# Patient Record
Sex: Male | Born: 1945 | Race: White | Hispanic: No | Marital: Married | Smoking: Never smoker
Health system: Southern US, Community
[De-identification: ages and names within clinical notes are randomized; demographics above are authoritative.]

## PROBLEM LIST (undated history)

## (undated) DIAGNOSIS — N289 Disorder of kidney and ureter, unspecified: Secondary | ICD-10-CM

## (undated) DIAGNOSIS — D696 Thrombocytopenia, unspecified: Secondary | ICD-10-CM

## (undated) DIAGNOSIS — E119 Type 2 diabetes mellitus without complications: Secondary | ICD-10-CM

## (undated) DIAGNOSIS — I700 Atherosclerosis of aorta: Secondary | ICD-10-CM

## (undated) DIAGNOSIS — Z6841 Body Mass Index (BMI) 40.0 and over, adult: Secondary | ICD-10-CM

## (undated) DIAGNOSIS — H348122 Central retinal vein occlusion, left eye, stable: Secondary | ICD-10-CM

## (undated) DIAGNOSIS — N281 Cyst of kidney, acquired: Secondary | ICD-10-CM

## (undated) DIAGNOSIS — K7689 Other specified diseases of liver: Secondary | ICD-10-CM

## (undated) DIAGNOSIS — I509 Heart failure, unspecified: Secondary | ICD-10-CM

## (undated) DIAGNOSIS — N189 Chronic kidney disease, unspecified: Secondary | ICD-10-CM

## (undated) HISTORY — PX: CHOLECYSTECTOMY: SHX55

## (undated) HISTORY — PX: APPENDECTOMY: SHX54

---

## 2008-06-30 ENCOUNTER — Ambulatory Visit (HOSPITAL_BASED_OUTPATIENT_CLINIC_OR_DEPARTMENT_OTHER): Admission: RE | Admit: 2008-06-30 | Discharge: 2008-07-01 | Payer: Self-pay | Admitting: Orthopedic Surgery

## 2010-12-21 NOTE — Op Note (Signed)
NAME:  Logan Cole, POP NO.:  [id number]   MEDICAL RECORD NO.:  [id number]          PATIENT TYPE:  AMB   LOCATION:  DSC                          FACILITY:  [facility]   PHYSICIAN:  Robert A. Thurston Hole, M.D. DATE OF BIRTH:  [birth year]   DATE OF PROCEDURE:  06/30/2008  DATE OF DISCHARGE:                               OPERATIVE REPORT   PREOPERATIVE DIAGNOSES:  1. Right shoulder Bankart tear with partial labrum tear.  2. Right shoulder superior labrum anterior and posterior type 1 tear.  3. Right shoulder partial rotator cuff tear.  4. Right shoulder impingement.   POSTOPERATIVE DIAGNOSES:  1. Right shoulder Bankart tear with partial labrum tear.  2. Right shoulder superior labrum anterior and posterior type 1 tear.  3. Right shoulder partial rotator cuff tear.  4. Right shoulder impingement.   PROCEDURES:  1. Right shoulder examination under anesthesia followed by      arthroscopically-assisted Bankart repair using Arthrex PushLock      anchors x2.  2. Right shoulder superior labrum anterior and posterior tear      debridement.  3. Right shoulder partial rotator cuff tear debridement.  4. Right shoulder subacromial decompression.   SURGEON:  Elana Alm. Thurston Hole, MD   ASSISTANT:  Julien Girt, PA   ANESTHESIA:  General.   OPERATIVE TIME:  1 hour.   COMPLICATIONS:  None.   INDICATIONS FOR PROCEDURE:  Logan Cole is a 65 year old gentleman who had  a significant injury to his right shoulder approximately 4-5 months ago.  He was reaching for something, felt the shoulder possibly pop out and  back in.  Examining the MRI has revealed a partial rotator cuff tear,  question of a labrum tear.  He has failed conservative care and is now  to undergo arthroscopy and repair.   DESCRIPTION:  Logan Cole was brought to the operating room on June 30, 2008, after interscalene blocks placed on by Anesthesia.  He was  placed on the operating table in supine position.  He  received Ancef 2 g  IV preoperatively for prophylaxis.  After being placed under general  anesthesia, his right shoulder was examined.  Initial range of motion  showed forward flexion of 160, abduction of 160, and internal and  external rotation of 70 degrees.  A gentle manipulation was carried out  breaking up soft adhesions and improving range of motion to full range  of motion.  No definite instability was noted.  After this was done, he  was placed in a beach-chair position.  His shoulder and arm was prepped  using sterile DuraPrep and draped using sterile technique.  Originally,  through a posterior arthroscopic portal, the arthroscope with a pump  attached was placed into an anterior portal, and arthroscopic probe was  placed.  On initial inspection, the articular cartilage in the  glenohumeral joint was intact.  There was an anterior and inferior bony  Bankart lesion with a small piece of bone attached to the anterior,  inferior, and middle glenohumeral ligaments.  The superior labrum and  biceps tendon anchor was partially torn but was,  otherwise, well  attached, and this was partially debrided arthroscopically.  Posterior  labrum showed moderate fraying.  This was debrided.  The rotator cuff  showed partial tearing of 25-30% of supraspinatus and infraspinatus.  This was partially debrided arthroscopically, but it was, otherwise,  well attached, and the right rotator cuff was intact.  At this point,  through an accessory anterior portal, the anterior, inferior, and middle  glenoid rim was debrided.  After this was done, then Arthrex PushLock  anchor was deployed after mattress sutures were placed in the anterior  and inferior glenohumeral ligament and capsular bony complex, and then,  the PushLock anchor deployed in the 5 o'clock position, thus, securing  the anterior and inferior Bankart lesion back down to the glenoid rim  with a firm and tight repair.  The middle glenohumeral  ligament capsular  complex was then also repaired with another PushLock mattress suture  anchor in the 3 o'clock position with a firm and tight repair.  This  secured the entire anterior wall of the capsular tissue back down to its  anatomic position with a firm and tight repair.  No instability was  noted.  At this point, then the subacromial space was entered, and a  lateral arthroscopic portal was made.  Moderately thickened bursitis was  resected.  Rotator cuff was somewhat frayed on the bursal surface but no  other evidence of tearing.  Impingement was noted in the subacromial,  and decompression was carried out removing 6-8 mm of the undersurface of  the anterior, anterolateral, and anteromedial acromion and a CA ligament  release was carried out as well.  [facility] joint was not pathologic and  thus, was not resected.  After this was done, the shoulder could be  brought to the satisfactory range of motion with no impingement on the  rotator cuff and satisfactory fixation of the ligamentous complexes.  At  this point, I felt that all pathology had been satisfactorily dressed.  The instruments were removed.  Portals were closed with 3-0 nylon  sutures, sterile dressings and abduction sling applied, and then, the  patient awakened and taken to the recovery room in stable condition.   FOLLOWUP CARE:  [name] will follow as an outpatient on Percocet and  Robaxin with early physical therapy per protocol.  Seen back in the  office in 1 week for sutures out and followup.      Robert A. Thurston Hole, M.D.  Electronically Signed     RAW/MEDQ  D:  06/30/2008  T:  07/01/2008  Job:  [id number]

## 2011-05-10 LAB — GLUCOSE, CAPILLARY
Glucose-Capillary: 133 — ABNORMAL HIGH
Glucose-Capillary: 167 — ABNORMAL HIGH

## 2011-05-10 LAB — BASIC METABOLIC PANEL
CO2: 25
Calcium: 9.1
Creatinine, Ser: 1.35
GFR calc Af Amer: >60

## 2011-05-10 LAB — POCT HEMOGLOBIN-HEMACUE: Hemoglobin: 12.2 — ABNORMAL LOW

## 2017-09-26 ENCOUNTER — Inpatient Hospital Stay (HOSPITAL_COMMUNITY)
Admission: EM | Admit: 2017-09-26 | Discharge: 2017-10-01 | DRG: 291 | Disposition: A | Discharge status: Home / Self Care | Payer: Non-veteran care | Attending: Family Medicine | Admitting: Family Medicine

## 2017-09-26 ENCOUNTER — Encounter (HOSPITAL_COMMUNITY): Payer: Self-pay

## 2017-09-26 ENCOUNTER — Other Ambulatory Visit: Payer: Self-pay

## 2017-09-26 ENCOUNTER — Emergency Department (HOSPITAL_COMMUNITY): Payer: Non-veteran care

## 2017-09-26 DIAGNOSIS — I130 Hypertensive heart and chronic kidney disease with heart failure and stage 1 through stage 4 chronic kidney disease, or unspecified chronic kidney disease: Secondary | ICD-10-CM | POA: Diagnosis not present

## 2017-09-26 DIAGNOSIS — N183 Chronic kidney disease, stage 3 unspecified: Secondary | ICD-10-CM | POA: Diagnosis present

## 2017-09-26 DIAGNOSIS — K7689 Other specified diseases of liver: Secondary | ICD-10-CM

## 2017-09-26 DIAGNOSIS — E1122 Type 2 diabetes mellitus with diabetic chronic kidney disease: Secondary | ICD-10-CM | POA: Diagnosis present

## 2017-09-26 DIAGNOSIS — R0600 Dyspnea, unspecified: Secondary | ICD-10-CM | POA: Diagnosis present

## 2017-09-26 DIAGNOSIS — K7200 Acute and subacute hepatic failure without coma: Secondary | ICD-10-CM | POA: Diagnosis present

## 2017-09-26 DIAGNOSIS — E1322 Other specified diabetes mellitus with diabetic chronic kidney disease: Secondary | ICD-10-CM

## 2017-09-26 DIAGNOSIS — E778 Other disorders of glycoprotein metabolism: Secondary | ICD-10-CM | POA: Diagnosis present

## 2017-09-26 DIAGNOSIS — E1365 Other specified diabetes mellitus with hyperglycemia: Secondary | ICD-10-CM

## 2017-09-26 DIAGNOSIS — R140 Abdominal distension (gaseous): Secondary | ICD-10-CM | POA: Diagnosis present

## 2017-09-26 DIAGNOSIS — D689 Coagulation defect, unspecified: Secondary | ICD-10-CM | POA: Diagnosis present

## 2017-09-26 DIAGNOSIS — T501X5A Adverse effect of loop [high-ceiling] diuretics, initial encounter: Secondary | ICD-10-CM | POA: Diagnosis present

## 2017-09-26 DIAGNOSIS — Z79899 Other long term (current) drug therapy: Secondary | ICD-10-CM

## 2017-09-26 DIAGNOSIS — Z7951 Long term (current) use of inhaled steroids: Secondary | ICD-10-CM

## 2017-09-26 DIAGNOSIS — E8809 Other disorders of plasma-protein metabolism, not elsewhere classified: Secondary | ICD-10-CM | POA: Diagnosis present

## 2017-09-26 DIAGNOSIS — I5033 Acute on chronic diastolic (congestive) heart failure: Secondary | ICD-10-CM | POA: Diagnosis present

## 2017-09-26 DIAGNOSIS — D61818 Other pancytopenia: Secondary | ICD-10-CM | POA: Diagnosis present

## 2017-09-26 DIAGNOSIS — D696 Thrombocytopenia, unspecified: Secondary | ICD-10-CM | POA: Diagnosis present

## 2017-09-26 DIAGNOSIS — N179 Acute kidney failure, unspecified: Secondary | ICD-10-CM | POA: Diagnosis present

## 2017-09-26 DIAGNOSIS — E1129 Type 2 diabetes mellitus with other diabetic kidney complication: Secondary | ICD-10-CM | POA: Diagnosis present

## 2017-09-26 DIAGNOSIS — R109 Unspecified abdominal pain: Secondary | ICD-10-CM | POA: Diagnosis present

## 2017-09-26 DIAGNOSIS — I509 Heart failure, unspecified: Secondary | ICD-10-CM

## 2017-09-26 DIAGNOSIS — Z794 Long term (current) use of insulin: Secondary | ICD-10-CM

## 2017-09-26 DIAGNOSIS — E876 Hypokalemia: Secondary | ICD-10-CM | POA: Diagnosis present

## 2017-09-26 DIAGNOSIS — Z7902 Long term (current) use of antithrombotics/antiplatelets: Secondary | ICD-10-CM

## 2017-09-26 DIAGNOSIS — I700 Atherosclerosis of aorta: Secondary | ICD-10-CM | POA: Diagnosis present

## 2017-09-26 DIAGNOSIS — R0602 Shortness of breath: Secondary | ICD-10-CM | POA: Diagnosis not present

## 2017-09-26 DIAGNOSIS — R188 Other ascites: Secondary | ICD-10-CM | POA: Diagnosis present

## 2017-09-26 DIAGNOSIS — K7460 Unspecified cirrhosis of liver: Secondary | ICD-10-CM | POA: Diagnosis present

## 2017-09-26 DIAGNOSIS — N182 Chronic kidney disease, stage 2 (mild): Secondary | ICD-10-CM

## 2017-09-26 DIAGNOSIS — R0989 Other specified symptoms and signs involving the circulatory and respiratory systems: Secondary | ICD-10-CM | POA: Diagnosis present

## 2017-09-26 DIAGNOSIS — Z833 Family history of diabetes mellitus: Secondary | ICD-10-CM

## 2017-09-26 DIAGNOSIS — R601 Generalized edema: Secondary | ICD-10-CM

## 2017-09-26 DIAGNOSIS — T502X5A Adverse effect of carbonic-anhydrase inhibitors, benzothiadiazides and other diuretics, initial encounter: Secondary | ICD-10-CM | POA: Diagnosis present

## 2017-09-26 DIAGNOSIS — IMO0002 Reserved for concepts with insufficient information to code with codable children: Secondary | ICD-10-CM

## 2017-09-26 DIAGNOSIS — E119 Type 2 diabetes mellitus without complications: Secondary | ICD-10-CM

## 2017-09-26 DIAGNOSIS — Z6841 Body Mass Index (BMI) 40.0 and over, adult: Secondary | ICD-10-CM

## 2017-09-26 HISTORY — DX: Heart failure, unspecified: I50.9

## 2017-09-26 HISTORY — DX: Body Mass Index (BMI) 40.0 and over, adult: Z684

## 2017-09-26 HISTORY — DX: Disorder of kidney and ureter, unspecified: N28.9

## 2017-09-26 HISTORY — DX: Chronic kidney disease, unspecified: N18.9

## 2017-09-26 HISTORY — DX: Other specified diseases of liver: K76.89

## 2017-09-26 HISTORY — DX: Central retinal vein occlusion, left eye, stable: H34.8122

## 2017-09-26 HISTORY — DX: Type 2 diabetes mellitus without complications: E11.9

## 2017-09-26 HISTORY — DX: Thrombocytopenia, unspecified: D69.6

## 2017-09-26 HISTORY — DX: Atherosclerosis of aorta: I70.0

## 2017-09-26 HISTORY — DX: Cyst of kidney, acquired: N28.1

## 2017-09-26 HISTORY — DX: Morbid (severe) obesity due to excess calories: E66.01

## 2017-09-26 LAB — I-STAT TROPONIN, ED: Troponin i, poc: 0 ng/mL (ref 0.00–0.08)

## 2017-09-26 LAB — BASIC METABOLIC PANEL
ANION GAP: 10 (ref 5–15)
BUN: 13 mg/dL (ref 6–20)
CALCIUM: 7.9 mg/dL — AB (ref 8.9–10.3)
CO2: 26 mmol/L (ref 22–32)
Chloride: 102 mmol/L (ref 101–111)
Creatinine, Ser: 1.54 mg/dL — ABNORMAL HIGH (ref 0.61–1.24)
GFR calc Af Amer: 50 mL/min — ABNORMAL LOW (ref >60)
GFR calc non Af Amer: 43 mL/min — ABNORMAL LOW (ref >60)
GLUCOSE: 197 mg/dL — AB (ref 65–99)
Potassium: 3.6 mmol/L (ref 3.5–5.1)
Sodium: 138 mmol/L (ref 135–145)

## 2017-09-26 LAB — CBC
HEMATOCRIT: 29.3 % — AB (ref 39.0–52.0)
HEMOGLOBIN: 9.3 g/dL — AB (ref 13.0–17.0)
MCH: 28.4 pg (ref 26.0–34.0)
MCHC: 31.7 g/dL (ref 30.0–36.0)
MCV: 89.6 fL (ref 78.0–100.0)
Platelets: 120 10*3/uL — ABNORMAL LOW (ref 150–400)
RBC: 3.27 MIL/uL — ABNORMAL LOW (ref 4.22–5.81)
RDW: 14.9 % (ref 11.5–15.5)
WBC: 4 10*3/uL (ref 4.0–10.5)

## 2017-09-26 MED ORDER — HYDROMORPHONE HCL 1 MG/ML IJ SOLN
1.0000 mg | Freq: Once | INTRAMUSCULAR | Status: AC
  Administered 2017-09-27: 1 mg via INTRAVENOUS
  Filled 2017-09-26: qty 1

## 2017-09-26 MED ORDER — FUROSEMIDE 10 MG/ML IJ SOLN
40.0000 mg | INTRAMUSCULAR | Status: AC
  Administered 2017-09-27: 40 mg via INTRAVENOUS
  Filled 2017-09-26: qty 4

## 2017-09-26 MED ORDER — ONDANSETRON HCL 4 MG/2ML IJ SOLN
4.0000 mg | Freq: Once | INTRAMUSCULAR | Status: AC
  Administered 2017-09-27: 4 mg via INTRAVENOUS
  Filled 2017-09-26: qty 2

## 2017-09-26 NOTE — ED Provider Notes (Signed)
[facility] EMERGENCY DEPARTMENT Provider Note   CSN: [id number] Arrival date & time: 09/26/17  1750     History   Chief Complaint Chief Complaint  Patient presents with  . Abdominal Pain    HPI Logan Cole is a 72 y.o. male.  Patient with PMH of CHF, liver disease, renal disorder, and DM presents to the ED with a chief complaint of abdominal distention.  He reports having had intermittent abdominal pain and distension for years, but reports that the symptoms acutely worsened this week.  He states that the distension is severe.  He reports that his pain is 7/10.  He reports associated nausea and vomiting.  He has been having normal BMs.  He reports some associated SOB.  He denies any other associated symptoms.   The history is provided by the patient. No language interpreter was used.    Past Medical History:  Diagnosis Date  . CHF (congestive heart failure) (HCC)   . Diabetes mellitus without complication (HCC)   . Liver dysfunction   . Renal disorder     There are no active problems to display for this patient.   Past Surgical History:  Procedure Laterality Date  . APPENDECTOMY    . CHOLECYSTECTOMY         Home Medications    Prior to Admission medications   Medication Sig Start Date End Date Taking? Authorizing Provider  acetaminophen (TYLENOL) 500 MG tablet Take 500 mg by mouth every 6 (six) hours as needed for mild pain.   Yes [provider]  ammonium lactate (LAC-HYDRIN) 12 % lotion Apply 1 application topically daily as needed for dry skin.   Yes [provider]  capsaicin (ZOSTRIX) 0.025 % cream Apply 1 application topically 3 (three) times daily as needed (skin care).   Yes [provider]  chlorhexidine (PERIDEX) 0.12 % solution Use as directed 15 mLs in the mouth or throat 2 (two) times daily as needed (mouth care).   Yes [provider]  clopidogrel (PLAVIX) 75 MG tablet Take 75 mg by mouth  daily.   Yes [provider]  ferrous sulfate 325 (65 FE) MG tablet Take 325 mg by mouth daily.   Yes [provider]  FLUoxetine (PROZAC) 20 MG tablet Take 40 mg by mouth every morning.   Yes [provider]  folic acid (FOLVITE) 1 MG tablet Take 1 mg by mouth daily.   Yes [provider]  gabapentin (NEURONTIN) 300 MG capsule Take 300 mg by mouth 3 (three) times daily.   Yes [provider]  hydrOXYzine (ATARAX/VISTARIL) 50 MG tablet Take 50 mg by mouth 3 (three) times daily.   Yes [provider]  insulin aspart (NOVOLOG FLEXPEN) 100 UNIT/ML FlexPen Inject 1-50 Units into the skin 3 (three) times daily with meals.   Yes [provider]  insulin glargine (LANTUS) 100 unit/mL SOPN Inject 50 Units into the skin 2 (two) times daily.   Yes [provider]  ipratropium-albuterol (DUONEB) 0.5-2.5 (3) MG/3ML SOLN Take 3 mLs by nebulization every 6 (six) hours as needed (SOB).   Yes [provider]  loperamide (IMODIUM) 2 MG capsule Take 2 mg by mouth 4 (four) times daily as needed for diarrhea or loose stools.   Yes [provider]  loratadine (CLARITIN) 10 MG tablet Take 10 mg by mouth daily as needed for allergies.   Yes [provider]  metoprolol tartrate (LOPRESSOR) 25 MG tablet Take 12.5 mg by  mouth 2 (two) times daily.   Yes [provider]  oxyCODONE (OXY IR/ROXICODONE) 5 MG immediate release tablet Take 10 mg by mouth every 4 (four) hours as needed for severe pain.   Yes [provider]  pantoprazole (PROTONIX) 40 MG tablet Take 40 mg by mouth daily as needed (acid reflux).   Yes [provider]  pilocarpine (SALAGEN) 5 MG tablet Take 5 mg by mouth 4 (four) times daily.   Yes [provider]  pravastatin (PRAVACHOL) 20 MG tablet Take 20 mg by mouth at bedtime.   Yes [provider]  prazosin (MINIPRESS) 5 MG capsule Take 10 mg by mouth at bedtime.   Yes  [provider]  QUEtiapine (SEROQUEL) 100 MG tablet Take 100 mg by mouth at bedtime.   Yes [provider]  rifaximin (XIFAXAN) 550 MG TABS tablet Take 550 mg by mouth 3 (three) times daily.   Yes [provider]  sodium fluoride (LURIDE) 1.1 (0.5 F) MG/ML SOLN Take 1 drop by mouth 2 (two) times daily as needed (mouth care).   Yes [provider]  zinc sulfate 220 (50 Zn) MG capsule Take 220 mg by mouth daily.   Yes [provider]    Family History No family history on file.  Social History Social History   Tobacco Use  . Smoking status: Never Smoker  . Smokeless tobacco: Never Used  Substance Use Topics  . Alcohol use: No    Frequency: Never  . Drug use: No     Allergies   Morphine and related   Review of Systems Review of Systems  All other systems reviewed and are negative.    Physical Exam Updated Vital Signs BP (!) 155/69   Pulse 70   Temp 99 F (37.2 C) (Oral)   Resp 16   Ht 6' (1.829 m)   Wt (!) 154.2 kg (340 lb)   SpO2 98%   BMI 46.11 kg/m   Physical Exam  Constitutional: He is oriented to person, place, and time. He appears well-developed and well-nourished.  HENT:  Head: Normocephalic and atraumatic.  Eyes: Conjunctivae and EOM are normal. Pupils are equal, round, and reactive to light. Right eye exhibits no discharge. Left eye exhibits no discharge. No scleral icterus.  Neck: Normal range of motion. Neck supple. No JVD present.  Cardiovascular: Normal rate, regular rhythm and normal heart sounds. Exam reveals no gallop and no friction rub.  No murmur heard. Pulmonary/Chest: Effort normal and breath sounds normal. No respiratory distress. He has no wheezes. He has no rales. He exhibits no tenderness.  Abdominal: Soft. He exhibits distension. He exhibits no mass. There is no tenderness. There is no rebound and no guarding.  Musculoskeletal: Normal range of motion. He exhibits edema. He exhibits no  tenderness.  1+ pitting edema  Neurological: He is alert and oriented to person, place, and time.  Skin: Skin is warm and dry.  Psychiatric: He has a normal mood and affect. His behavior is normal. Judgment and thought content normal.  Nursing note and vitals reviewed.    ED Treatments / Results  Labs (all labs ordered are listed, but only abnormal results are displayed) Labs Reviewed  BASIC METABOLIC PANEL - Abnormal; Notable for the following components:      Result Value   Glucose, Bld 197 (*)    Creatinine, Ser 1.54 (*)    Calcium 7.9 (*)    GFR calc non Af Amer 43 (*)  GFR calc Af Amer 50 (*)    All other components within normal limits  CBC - Abnormal; Notable for the following components:   RBC 3.27 (*)    Hemoglobin 9.3 (*)    HCT 29.3 (*)    Platelets 120 (*)    All other components within normal limits  BRAIN NATRIURETIC PEPTIDE  I-STAT TROPONIN, ED    EKG  EKG Interpretation None       Radiology Dg Chest 2 View  Result Date: 09/26/2017 CLINICAL DATA:  Cough and fever EXAM: CHEST  2 VIEW COMPARISON:  November 14, 2016 FINDINGS: There is no edema or consolidation. There is cardiomegaly with pulmonary venous hypertension. No adenopathy. There is aortic atherosclerosis. No evident bone lesions. IMPRESSION: Pulmonary vascular congestion without edema or consolidation. There is aortic atherosclerosis. There is mild elevation of the left hemidiaphragm. Aortic Atherosclerosis (ICD10-I70.0). Electronically Signed   By: Bretta BangWilliam  Woodruff III M.D.   On: 09/26/2017 19:14    Procedures Procedures (including critical care time)  Medications Ordered in ED Medications  HYDROmorphone (DILAUDID) injection 1 mg (not administered)  ondansetron (ZOFRAN) injection 4 mg (not administered)  furosemide (LASIX) injection 40 mg (not administered)     Initial Impression / Assessment and Plan / ED Course  I have reviewed the triage vital signs and the nursing notes.  Pertinent  labs & imaging results that were available during my care of the patient were reviewed by me and considered in my medical decision making (see chart for details).     Patient with abdominal pain, distension, SOB, and n/v.  No diarrhea.  VSS.  Obviously distended.  Hx of CHF.  Likely volume overload, but given pain, n/v, will check CT.  Will give 40 mg lasix.  Will reassess.  CT shows ascites.  VSS.  Afebrile.  Doubt SBP, but will cover with rocephin.  Seen by and discussed with Dr. Nicanor AlconPalumbo, who agrees with plan for admission.  Appreciate Dr. Clyde LundborgNiu, for admitting the patient.  Final Clinical Impressions(s) / ED Diagnoses   Final diagnoses:  Other ascites  Anasarca    ED Discharge Orders    None       Roxy HorsemanBrowning, Ethel Meisenheimer, PA-C 09/27/17 [id number]    Palumbo, April, MD 09/27/17 [phone]

## 2017-09-26 NOTE — ED Triage Notes (Signed)
Per Pt, Pt is coming from home with complaints of RUQ pain that has been intermittent for one year. Reports some vomiting and diarrhea along with increase fluid on his body.

## 2017-09-27 ENCOUNTER — Inpatient Hospital Stay (HOSPITAL_COMMUNITY): Payer: Non-veteran care

## 2017-09-27 ENCOUNTER — Encounter (HOSPITAL_COMMUNITY): Payer: Self-pay | Admitting: Emergency Medicine

## 2017-09-27 ENCOUNTER — Emergency Department (HOSPITAL_COMMUNITY): Payer: Non-veteran care

## 2017-09-27 ENCOUNTER — Other Ambulatory Visit: Payer: Self-pay

## 2017-09-27 DIAGNOSIS — R0989 Other specified symptoms and signs involving the circulatory and respiratory systems: Secondary | ICD-10-CM | POA: Diagnosis not present

## 2017-09-27 DIAGNOSIS — Z794 Long term (current) use of insulin: Secondary | ICD-10-CM | POA: Diagnosis not present

## 2017-09-27 DIAGNOSIS — R0600 Dyspnea, unspecified: Secondary | ICD-10-CM | POA: Diagnosis present

## 2017-09-27 DIAGNOSIS — Z6841 Body Mass Index (BMI) 40.0 and over, adult: Secondary | ICD-10-CM | POA: Diagnosis not present

## 2017-09-27 DIAGNOSIS — K7460 Unspecified cirrhosis of liver: Secondary | ICD-10-CM | POA: Diagnosis present

## 2017-09-27 DIAGNOSIS — I700 Atherosclerosis of aorta: Secondary | ICD-10-CM | POA: Diagnosis present

## 2017-09-27 DIAGNOSIS — K7200 Acute and subacute hepatic failure without coma: Secondary | ICD-10-CM | POA: Diagnosis present

## 2017-09-27 DIAGNOSIS — N281 Cyst of kidney, acquired: Secondary | ICD-10-CM | POA: Insufficient documentation

## 2017-09-27 DIAGNOSIS — E119 Type 2 diabetes mellitus without complications: Secondary | ICD-10-CM | POA: Diagnosis not present

## 2017-09-27 DIAGNOSIS — Z79899 Other long term (current) drug therapy: Secondary | ICD-10-CM | POA: Diagnosis not present

## 2017-09-27 DIAGNOSIS — R140 Abdominal distension (gaseous): Secondary | ICD-10-CM | POA: Diagnosis not present

## 2017-09-27 DIAGNOSIS — N183 Chronic kidney disease, stage 3 unspecified: Secondary | ICD-10-CM | POA: Diagnosis present

## 2017-09-27 DIAGNOSIS — E876 Hypokalemia: Secondary | ICD-10-CM | POA: Diagnosis present

## 2017-09-27 DIAGNOSIS — N179 Acute kidney failure, unspecified: Secondary | ICD-10-CM | POA: Diagnosis present

## 2017-09-27 DIAGNOSIS — I130 Hypertensive heart and chronic kidney disease with heart failure and stage 1 through stage 4 chronic kidney disease, or unspecified chronic kidney disease: Secondary | ICD-10-CM | POA: Diagnosis present

## 2017-09-27 DIAGNOSIS — D696 Thrombocytopenia, unspecified: Secondary | ICD-10-CM | POA: Diagnosis not present

## 2017-09-27 DIAGNOSIS — E1322 Other specified diabetes mellitus with diabetic chronic kidney disease: Secondary | ICD-10-CM | POA: Diagnosis not present

## 2017-09-27 DIAGNOSIS — I509 Heart failure, unspecified: Secondary | ICD-10-CM

## 2017-09-27 DIAGNOSIS — R109 Unspecified abdominal pain: Secondary | ICD-10-CM | POA: Diagnosis present

## 2017-09-27 DIAGNOSIS — E1122 Type 2 diabetes mellitus with diabetic chronic kidney disease: Secondary | ICD-10-CM | POA: Diagnosis present

## 2017-09-27 DIAGNOSIS — D689 Coagulation defect, unspecified: Secondary | ICD-10-CM | POA: Diagnosis present

## 2017-09-27 DIAGNOSIS — I5033 Acute on chronic diastolic (congestive) heart failure: Secondary | ICD-10-CM | POA: Diagnosis present

## 2017-09-27 DIAGNOSIS — Z833 Family history of diabetes mellitus: Secondary | ICD-10-CM | POA: Diagnosis not present

## 2017-09-27 DIAGNOSIS — E8809 Other disorders of plasma-protein metabolism, not elsewhere classified: Secondary | ICD-10-CM | POA: Diagnosis present

## 2017-09-27 DIAGNOSIS — R0602 Shortness of breath: Secondary | ICD-10-CM | POA: Diagnosis present

## 2017-09-27 DIAGNOSIS — T501X5A Adverse effect of loop [high-ceiling] diuretics, initial encounter: Secondary | ICD-10-CM | POA: Diagnosis present

## 2017-09-27 DIAGNOSIS — E1129 Type 2 diabetes mellitus with other diabetic kidney complication: Secondary | ICD-10-CM | POA: Diagnosis present

## 2017-09-27 DIAGNOSIS — R188 Other ascites: Secondary | ICD-10-CM | POA: Diagnosis present

## 2017-09-27 DIAGNOSIS — D61818 Other pancytopenia: Secondary | ICD-10-CM | POA: Diagnosis present

## 2017-09-27 DIAGNOSIS — R1011 Right upper quadrant pain: Secondary | ICD-10-CM | POA: Diagnosis not present

## 2017-09-27 DIAGNOSIS — E778 Other disorders of glycoprotein metabolism: Secondary | ICD-10-CM | POA: Diagnosis present

## 2017-09-27 DIAGNOSIS — Z7951 Long term (current) use of inhaled steroids: Secondary | ICD-10-CM | POA: Diagnosis not present

## 2017-09-27 DIAGNOSIS — K7689 Other specified diseases of liver: Secondary | ICD-10-CM | POA: Insufficient documentation

## 2017-09-27 DIAGNOSIS — T502X5A Adverse effect of carbonic-anhydrase inhibitors, benzothiadiazides and other diuretics, initial encounter: Secondary | ICD-10-CM | POA: Diagnosis present

## 2017-09-27 DIAGNOSIS — Z7902 Long term (current) use of antithrombotics/antiplatelets: Secondary | ICD-10-CM | POA: Diagnosis not present

## 2017-09-27 DIAGNOSIS — R601 Generalized edema: Secondary | ICD-10-CM | POA: Diagnosis not present

## 2017-09-27 DIAGNOSIS — N182 Chronic kidney disease, stage 2 (mild): Secondary | ICD-10-CM | POA: Diagnosis not present

## 2017-09-27 LAB — URINALYSIS, ROUTINE W REFLEX MICROSCOPIC
BILIRUBIN URINE: NEGATIVE
Glucose, UA: NEGATIVE mg/dL
HGB URINE DIPSTICK: NEGATIVE
KETONES UR: NEGATIVE mg/dL
Leukocytes, UA: NEGATIVE
NITRITE: NEGATIVE
PROTEIN: NEGATIVE mg/dL
Specific Gravity, Urine: 1.013 (ref 1.005–1.030)
pH: 5 (ref 5.0–8.0)

## 2017-09-27 LAB — PROTIME-INR
INR: 1.5
Prothrombin Time: 18 seconds — ABNORMAL HIGH (ref 11.4–15.2)

## 2017-09-27 LAB — HEMOGLOBIN A1C
Hgb A1c MFr Bld: 6.4 % — ABNORMAL HIGH (ref 4.8–5.6)
MEAN PLASMA GLUCOSE: 136.98 mg/dL

## 2017-09-27 LAB — HEPATIC FUNCTION PANEL
ALBUMIN: 2.3 g/dL — AB (ref 3.5–5.0)
ALT: 14 U/L — AB (ref 17–63)
AST: 31 U/L (ref 15–41)
Alkaline Phosphatase: 63 U/L (ref 38–126)
BILIRUBIN DIRECT: 0.2 mg/dL (ref 0.1–0.5)
BILIRUBIN TOTAL: 0.9 mg/dL (ref 0.3–1.2)
Indirect Bilirubin: 0.7 mg/dL (ref 0.3–0.9)
Total Protein: 6.5 g/dL (ref 6.5–8.1)

## 2017-09-27 LAB — GLUCOSE, CAPILLARY
GLUCOSE-CAPILLARY: 142 mg/dL — AB (ref 65–99)
GLUCOSE-CAPILLARY: 134 mg/dL — AB (ref 65–99)
GLUCOSE-CAPILLARY: 170 mg/dL — AB (ref 65–99)
Glucose-Capillary: 151 mg/dL — ABNORMAL HIGH (ref 65–99)

## 2017-09-27 LAB — AMMONIA: Ammonia: 45 umol/L — ABNORMAL HIGH (ref 9–35)

## 2017-09-27 LAB — BRAIN NATRIURETIC PEPTIDE: B Natriuretic Peptide: 57.5 pg/mL (ref 0.0–100.0)

## 2017-09-27 LAB — CBG MONITORING, ED: Glucose-Capillary: 149 mg/dL — ABNORMAL HIGH (ref 65–99)

## 2017-09-27 MED ORDER — SODIUM CHLORIDE 0.9 % IV SOLN
1.0000 g | Freq: Once | INTRAVENOUS | Status: AC
  Administered 2017-09-27: 1 g via INTRAVENOUS
  Filled 2017-09-27: qty 10

## 2017-09-27 MED ORDER — SODIUM CHLORIDE 0.9% FLUSH: 3.0000 mL | INTRAVENOUS | Status: DC | PRN

## 2017-09-27 MED ORDER — INSULIN ASPART 100 UNIT/ML ~~LOC~~ SOLN
0.0000 [IU] | Freq: Three times a day (TID) | SUBCUTANEOUS | Status: DC
  Administered 2017-09-27 (×2): 2 [IU] via SUBCUTANEOUS
  Administered 2017-09-27 – 2017-09-28 (×2): 3 [IU] via SUBCUTANEOUS
  Administered 2017-09-28: 2 [IU] via SUBCUTANEOUS
  Administered 2017-09-28 – 2017-09-29 (×2): 3 [IU] via SUBCUTANEOUS
  Administered 2017-09-29: 2 [IU] via SUBCUTANEOUS
  Administered 2017-09-29: 3 [IU] via SUBCUTANEOUS
  Administered 2017-09-30: 2 [IU] via SUBCUTANEOUS
  Administered 2017-09-30 – 2017-10-01 (×3): 3 [IU] via SUBCUTANEOUS

## 2017-09-27 MED ORDER — ONDANSETRON HCL 4 MG PO TABS
4.0000 mg | ORAL_TABLET | Freq: Four times a day (QID) | ORAL | Status: DC | PRN
  Administered 2017-09-27 – 2017-09-29 (×2): 4 mg via ORAL
  Filled 2017-09-27 (×2): qty 1

## 2017-09-27 MED ORDER — SODIUM CHLORIDE 0.9 % IV SOLN
2.0000 g | INTRAVENOUS | Status: DC
  Administered 2017-09-27: 2 g via INTRAVENOUS
  Filled 2017-09-27: qty 20

## 2017-09-27 MED ORDER — FUROSEMIDE 10 MG/ML IJ SOLN
40.0000 mg | INTRAMUSCULAR | Status: AC
  Administered 2017-09-27: 40 mg via INTRAVENOUS
  Filled 2017-09-27: qty 4

## 2017-09-27 MED ORDER — LOPERAMIDE HCL 2 MG PO CAPS: 2.0000 mg | ORAL_CAPSULE | Freq: Four times a day (QID) | ORAL | Status: DC | PRN

## 2017-09-27 MED ORDER — INSULIN GLARGINE 100 UNIT/ML ~~LOC~~ SOLN
35.0000 [IU] | Freq: Two times a day (BID) | SUBCUTANEOUS | Status: DC
  Filled 2017-09-27: qty 0.35

## 2017-09-27 MED ORDER — PRAZOSIN HCL 2 MG PO CAPS
10.0000 mg | ORAL_CAPSULE | Freq: Every day | ORAL | Status: DC
  Administered 2017-09-28 – 2017-09-30 (×4): 10 mg via ORAL
  Filled 2017-09-27 (×5): qty 5

## 2017-09-27 MED ORDER — CAPSAICIN 0.025 % EX CREA: 1.0000 "application " | TOPICAL_CREAM | Freq: Three times a day (TID) | CUTANEOUS | Status: DC | PRN

## 2017-09-27 MED ORDER — OXYCODONE HCL 5 MG PO TABS
10.0000 mg | ORAL_TABLET | ORAL | Status: DC | PRN
  Administered 2017-09-27 – 2017-09-28 (×4): 10 mg via ORAL
  Filled 2017-09-27 (×4): qty 2

## 2017-09-27 MED ORDER — PRAVASTATIN SODIUM 20 MG PO TABS: 20.0000 mg | ORAL_TABLET | Freq: Every day | ORAL | Status: DC

## 2017-09-27 MED ORDER — ZINC SULFATE 220 (50 ZN) MG PO CAPS
220.0000 mg | ORAL_CAPSULE | Freq: Every day | ORAL | Status: DC
  Administered 2017-09-27 – 2017-10-01 (×5): 220 mg via ORAL
  Filled 2017-09-27 (×6): qty 1

## 2017-09-27 MED ORDER — IOPAMIDOL (ISOVUE-300) INJECTION 61%
INTRAVENOUS | Status: AC
  Administered 2017-09-27: 100 mL
  Filled 2017-09-27: qty 100

## 2017-09-27 MED ORDER — FOLIC ACID 1 MG PO TABS
1.0000 mg | ORAL_TABLET | Freq: Every day | ORAL | Status: DC
Start: 2017-09-27 — End: 2017-10-01
  Administered 2017-09-27 – 2017-10-01 (×5): 1 mg via ORAL
  Filled 2017-09-27 (×5): qty 1

## 2017-09-27 MED ORDER — INSULIN ASPART 100 UNIT/ML ~~LOC~~ SOLN: 0.0000 [IU] | Freq: Three times a day (TID) | SUBCUTANEOUS | Status: DC

## 2017-09-27 MED ORDER — RIFAXIMIN 550 MG PO TABS
550.0000 mg | ORAL_TABLET | Freq: Three times a day (TID) | ORAL | Status: DC
  Administered 2017-09-27 – 2017-10-01 (×13): 550 mg via ORAL
  Filled 2017-09-27 (×14): qty 1

## 2017-09-27 MED ORDER — FERROUS SULFATE 325 (65 FE) MG PO TABS: 325.0000 mg | ORAL_TABLET | Freq: Every day | ORAL | Status: DC

## 2017-09-27 MED ORDER — FLUOXETINE HCL 20 MG PO CAPS
40.0000 mg | ORAL_CAPSULE | Freq: Every day | ORAL | Status: DC
  Administered 2017-09-27 – 2017-10-01 (×5): 40 mg via ORAL
  Filled 2017-09-27 (×5): qty 2

## 2017-09-27 MED ORDER — BISACODYL 5 MG PO TBEC: 5.0000 mg | DELAYED_RELEASE_TABLET | Freq: Every day | ORAL | Status: DC | PRN

## 2017-09-27 MED ORDER — AMMONIUM LACTATE 12 % EX LOTN: 1.0000 "application " | TOPICAL_LOTION | Freq: Every day | CUTANEOUS | Status: DC | PRN

## 2017-09-27 MED ORDER — ACETAMINOPHEN 500 MG PO TABS: 500.0000 mg | ORAL_TABLET | Freq: Four times a day (QID) | ORAL | Status: DC | PRN

## 2017-09-27 MED ORDER — LORATADINE 10 MG PO TABS: 10.0000 mg | ORAL_TABLET | Freq: Every day | ORAL | Status: DC | PRN

## 2017-09-27 MED ORDER — QUETIAPINE FUMARATE 25 MG PO TABS
100.0000 mg | ORAL_TABLET | Freq: Every day | ORAL | Status: DC
  Administered 2017-09-27 – 2017-09-30 (×4): 100 mg via ORAL
  Filled 2017-09-27 (×4): qty 4

## 2017-09-27 MED ORDER — FUROSEMIDE 10 MG/ML IJ SOLN: 60.0000 mg | Freq: Two times a day (BID) | INTRAMUSCULAR | Status: DC

## 2017-09-27 MED ORDER — SODIUM CHLORIDE 0.9% FLUSH
3.0000 mL | Freq: Two times a day (BID) | INTRAVENOUS | Status: DC
  Administered 2017-09-27 – 2017-09-30 (×7): 3 mL via INTRAVENOUS

## 2017-09-27 MED ORDER — LACTULOSE 10 GM/15ML PO SOLN
20.0000 g | Freq: Two times a day (BID) | ORAL | Status: DC
  Administered 2017-09-27 – 2017-09-29 (×5): 20 g via ORAL
  Filled 2017-09-27 (×5): qty 30

## 2017-09-27 MED ORDER — CLOPIDOGREL BISULFATE 75 MG PO TABS: 75.0000 mg | ORAL_TABLET | Freq: Every day | ORAL | Status: DC

## 2017-09-27 MED ORDER — IPRATROPIUM-ALBUTEROL 0.5-2.5 (3) MG/3ML IN SOLN: 3.0000 mL | Freq: Four times a day (QID) | RESPIRATORY_TRACT | Status: DC | PRN

## 2017-09-27 MED ORDER — INSULIN ASPART 100 UNIT/ML ~~LOC~~ SOLN
0.0000 [IU] | Freq: Every day | SUBCUTANEOUS | Status: DC
  Administered 2017-09-30: 2 [IU] via SUBCUTANEOUS

## 2017-09-27 MED ORDER — HYDROXYZINE HCL 25 MG PO TABS
50.0000 mg | ORAL_TABLET | Freq: Three times a day (TID) | ORAL | Status: DC
  Administered 2017-09-27 – 2017-09-29 (×7): 50 mg via ORAL
  Filled 2017-09-27 (×7): qty 2

## 2017-09-27 MED ORDER — METOPROLOL TARTRATE 12.5 MG HALF TABLET
12.5000 mg | ORAL_TABLET | Freq: Two times a day (BID) | ORAL | Status: DC
  Administered 2017-09-27 (×2): 12.5 mg via ORAL
  Filled 2017-09-27 (×3): qty 1

## 2017-09-27 MED ORDER — PROMETHAZINE HCL 25 MG/ML IJ SOLN
12.5000 mg | Freq: Once | INTRAMUSCULAR | Status: AC
  Administered 2017-09-27: 12.5 mg via INTRAVENOUS
  Filled 2017-09-27: qty 1

## 2017-09-27 MED ORDER — ONDANSETRON HCL 4 MG/2ML IJ SOLN: 4.0000 mg | Freq: Four times a day (QID) | INTRAMUSCULAR | Status: DC | PRN

## 2017-09-27 MED ORDER — PANTOPRAZOLE SODIUM 40 MG PO TBEC: 40.0000 mg | DELAYED_RELEASE_TABLET | Freq: Every day | ORAL | Status: DC | PRN

## 2017-09-27 MED ORDER — SODIUM CHLORIDE 0.9 % IV SOLN: 250.0000 mL | INTRAVENOUS | Status: DC | PRN

## 2017-09-27 MED ORDER — IPRATROPIUM-ALBUTEROL 0.5-2.5 (3) MG/3ML IN SOLN
3.0000 mL | Freq: Four times a day (QID) | RESPIRATORY_TRACT | Status: DC
  Administered 2017-09-27 (×3): 3 mL via RESPIRATORY_TRACT
  Filled 2017-09-27 (×3): qty 3

## 2017-09-27 MED ORDER — FUROSEMIDE 10 MG/ML IJ SOLN
80.0000 mg | Freq: Two times a day (BID) | INTRAMUSCULAR | Status: DC
  Administered 2017-09-27 (×2): 80 mg via INTRAVENOUS
  Filled 2017-09-27 (×2): qty 8

## 2017-09-27 MED ORDER — CHLORHEXIDINE GLUCONATE 0.12 % MT SOLN
15.0000 mL | Freq: Two times a day (BID) | OROMUCOSAL | Status: DC | PRN
  Filled 2017-09-27: qty 15

## 2017-09-27 MED ORDER — INSULIN GLARGINE 100 UNIT/ML ~~LOC~~ SOLN
20.0000 [IU] | Freq: Every day | SUBCUTANEOUS | Status: DC
  Administered 2017-09-27 – 2017-09-30 (×4): 20 [IU] via SUBCUTANEOUS
  Filled 2017-09-27 (×5): qty 0.2

## 2017-09-27 MED ORDER — PILOCARPINE HCL 5 MG PO TABS
5.0000 mg | ORAL_TABLET | Freq: Three times a day (TID) | ORAL | Status: DC
  Administered 2017-09-27 – 2017-10-01 (×18): 5 mg via ORAL
  Filled 2017-09-27 (×21): qty 1

## 2017-09-27 MED ORDER — GABAPENTIN 300 MG PO CAPS
300.0000 mg | ORAL_CAPSULE | Freq: Three times a day (TID) | ORAL | Status: DC
  Administered 2017-09-27 – 2017-09-29 (×7): 300 mg via ORAL
  Filled 2017-09-27 (×7): qty 1

## 2017-09-27 NOTE — Progress Notes (Signed)
  I was present for limited ultrasound of the abdomen.  Images saved of the right lateral, anterior, and left lateral abdomen.  There is no ascites present.  Chart reviewed and patient has been diuresed with Lasix.  Patient reports he was "up all night urinating" and stated "that probably took care of it".  No paracentesis indicated today.  Javaya [place] S Anola Mcgough PA-C 09/27/2017 11:04 AM

## 2017-09-27 NOTE — Care Management (Addendum)
This is a no charge note  Pending admission per PA, Rob  72 year old male with a past medical history of CHF, liver cirrhosis, diabetes mellitus, hyperlipidemia, GERD, depression, iron deficiency anemia, CKD-3, who presents with abdominal distention, abdominal pain and shortness of breath. Chest x-ray showed vascular congestion. BNP 57.5. CT abdomen/pelvis showed moderate ascites. Pt was give lasx 40 mg x 2 by IV by EDP, and started rocephin in ED. Pt is admitted to telemetry bed as inpatient.   Lorretta HarpXilin Thorvald Orsino, MD  [facility] Pager [phone]  If 7PM-7AM, please contact night-coverage [email or web address] Password [email or web address] 09/27/2017, 3:01 AM

## 2017-09-27 NOTE — H&P (Signed)
History and Physical    Logan Cole GNF:[id number] DOB: 04/12/1946 DOA: 09/26/2017  PCP: [place] [facility] [place]  Chief Complaint: abdominal pain  HPI: Logan DeutscherWilliam Cole is a very pleasant 72 y.o. male with medical history significant for insulin-dependent diabetes, thrombocytopenia, liver cirrhosis, anasarca, chronic kidney disease stage III, CHF, morbid obesity, hypertension since in the emergency Department chief complaint upper quadrant pain. Initial evaluation reveals vascular congestion as well as moderate ascites and anasarca. [facility] are asked to admit  Information is obtained from the patient. He states over the last 6 or 7 days he noted an increase in abdominal distention and LE edema. Abdominal distention and abdominal pain are chronic problems but over the last 6 stasis been a gradual worsening. Associated symptoms include right upper quadrant pain, nausea with 3 episodes of emesis, lower extremity edema shortness of breath and wheezing. He denies chest pain palpitations headache dizziness syncope or near-syncope. He denies dysuria hematuria frequency or urgency. He denies diarrhea constipation melena bright red blood per rectum. He reports compliance with his medications.    ED Course: The emergency department he is provided with 80 mg of Lasix IV. He's afebrile hemodynamically stable and not hypoxic but noting at the time of admission oxygen saturation level only drifting down. It remains above 90% on room air  Review of Systems: As per HPI otherwise all other systems reviewed and are negative.   Ambulatory Status: Patient lives at home with his wife. He uses a walker and sometimes a wheelchair  Past Medical History:  Diagnosis Date  . CHF (congestive heart failure) (HCC)   . CKD (chronic kidney disease)   . Diabetes mellitus without complication (HCC)   . Liver dysfunction   . Renal cyst, right   . Renal disorder   . Retinal  vein occlusion of left eye   . Thrombocytopenia (HCC)     Past Surgical History:  Procedure Laterality Date  . APPENDECTOMY    . CHOLECYSTECTOMY      Social History   Socioeconomic History  . Marital status: Married    Spouse name: Not on file  . Number of children: Not on file  . Years of education: Not on file  . Highest education level: Not on file  Social Needs  . Financial resource strain: Not on file  . Food insecurity - worry: Not on file  . Food insecurity - inability: Not on file  . Transportation needs - medical: Not on file  . Transportation needs - non-medical: Not on file  Occupational History  . Not on file  Tobacco Use  . Smoking status: Never Smoker  . Smokeless tobacco: Never Used  Substance and Sexual Activity  . Alcohol use: No    Frequency: Never  . Drug use: No  . Sexual activity: Not on file  Other Topics Concern  . Not on file  Social History Narrative  . Not on file    Allergies  Allergen Reactions  . Morphine And Related Rash    Family History  Problem Relation Age of Onset  . Hypertension Mother   . Diabetes Father     Prior to Admission medications   Medication Sig Start Date End Date Taking? Authorizing Provider  acetaminophen (TYLENOL) 500 MG tablet Take 500 mg by mouth every 6 (six) hours as needed for mild pain.   Yes [provider]  ammonium lactate (LAC-HYDRIN) 12 % lotion Apply 1 application topically daily as needed for  dry skin.   Yes [provider]  capsaicin (ZOSTRIX) 0.025 % cream Apply 1 application topically 3 (three) times daily as needed (skin care).   Yes [provider]  chlorhexidine (PERIDEX) 0.12 % solution Use as directed 15 mLs in the mouth or throat 2 (two) times daily as needed (mouth care).   Yes [provider]  clopidogrel (PLAVIX) 75 MG tablet Take 75 mg by mouth daily.   Yes [provider]  ferrous sulfate 325 (65 FE) MG tablet Take 325 mg by mouth daily.    Yes [provider]  FLUoxetine (PROZAC) 20 MG tablet Take 40 mg by mouth every morning.   Yes [provider]  folic acid (FOLVITE) 1 MG tablet Take 1 mg by mouth daily.   Yes [provider]  gabapentin (NEURONTIN) 300 MG capsule Take 300 mg by mouth 3 (three) times daily.   Yes [provider]  hydrOXYzine (ATARAX/VISTARIL) 50 MG tablet Take 50 mg by mouth 3 (three) times daily.   Yes [provider]  insulin aspart (NOVOLOG FLEXPEN) 100 UNIT/ML FlexPen Inject 1-50 Units into the skin 3 (three) times daily with meals.   Yes [provider]  insulin glargine (LANTUS) 100 unit/mL SOPN Inject 50 Units into the skin 2 (two) times daily.   Yes [provider]  ipratropium-albuterol (DUONEB) 0.5-2.5 (3) MG/3ML SOLN Take 3 mLs by nebulization every 6 (six) hours as needed (SOB).   Yes [provider]  loperamide (IMODIUM) 2 MG capsule Take 2 mg by mouth 4 (four) times daily as needed for diarrhea or loose stools.   Yes [provider]  loratadine (CLARITIN) 10 MG tablet Take 10 mg by mouth daily as needed for allergies.   Yes [provider]  metoprolol tartrate (LOPRESSOR) 25 MG tablet Take 12.5 mg by mouth 2 (two) times daily.   Yes [provider]  oxyCODONE (OXY IR/ROXICODONE) 5 MG immediate release tablet Take 10 mg by mouth every 4 (four) hours as needed for severe pain.   Yes [provider]  pantoprazole (PROTONIX) 40 MG tablet Take 40 mg by mouth daily as needed (acid reflux).   Yes [provider]  pilocarpine (SALAGEN) 5 MG tablet Take 5 mg by mouth 4 (four) times daily.   Yes [provider]  pravastatin (PRAVACHOL) 20 MG tablet Take 20 mg by mouth at bedtime.   Yes [provider]  prazosin (MINIPRESS) 5 MG capsule Take 10 mg by mouth at bedtime.   Yes [provider]  QUEtiapine (SEROQUEL) 100 MG tablet Take 100 mg by mouth at bedtime.   Yes  [provider]  rifaximin (XIFAXAN) 550 MG TABS tablet Take 550 mg by mouth 3 (three) times daily.   Yes [provider]  sodium fluoride (LURIDE) 1.1 (0.5 F) MG/ML SOLN Take 1 drop by mouth 2 (two) times daily as needed (mouth care).   Yes [provider]  zinc sulfate 220 (50 Zn) MG capsule Take 220 mg by mouth daily.   Yes [provider]    Physical Exam: Vitals:   09/27/17 0330 09/27/17 0400 09/27/17 0430 09/27/17 0650  BP: 138/72 (!) 119/57 136/64 (!) 138/52  Pulse: 73 72 71 69  Resp: 14 14 14 18   Temp:    98.6 F (37 C)  TempSrc:    Oral  SpO2: 92% 91% 94% 94%  Weight:      Height:  General:  Appears calm obese in no acute distress Eyes:  PERRL, EOMI, normal lids, iris ENT:  grossly normal hearing, lips & tongue, mucous membranes of his mouth are pink but dry Neck:  no LAD, masses or thyromegaly Cardiovascular:  RRR, +murmur. 2+ LE edema.  Respiratory:  Mild increased work of breathing with conversation. Audible wheezing use of abdominal accessory muscles patient somewhat shallow Abdomen:  Obese distended firm sluggish bowel sounds nontender to palpation no guarding or rebounding Skin:  no rash or induration seen on limited exam Musculoskeletal:  grossly normal tone BUE/BLE, good ROM, no bony abnormality Psychiatric:  grossly normal mood and affect, speech fluent and appropriate, AOx3 Neurologic:  He is alert and oriented 3. Speech clear facial symmetry  Labs on Admission: I have personally reviewed following labs and imaging studies  CBC: Recent Labs  Lab 09/26/17 1820  WBC 4.0  HGB 9.3*  HCT 29.3*  MCV 89.6  PLT 120*   Basic Metabolic Panel: Recent Labs  Lab 09/26/17 1820  NA 138  K 3.6  CL 102  CO2 26  GLUCOSE 197*  BUN 13  CREATININE 1.54*  CALCIUM 7.9*   GFR: Estimated Creatinine Clearance: 66.4 mL/min (A) (by C-G formula based on SCr of 1.54 mg/dL (H)). Liver Function Tests: Recent Labs  Lab  09/26/17 1820  AST 31  ALT 14*  ALKPHOS 63  BILITOT 0.9  PROT 6.5  ALBUMIN 2.3*   No results for input(s): LIPASE, AMYLASE in the last 168 hours. Recent Labs  Lab 09/27/17 0306  AMMONIA 45*   Coagulation Profile: No results for input(s): INR, PROTIME in the last 168 hours. Cardiac Enzymes: No results for input(s): CKTOTAL, CKMB, CKMBINDEX, TROPONINI in the last 168 hours. BNP (last 3 results) No results for input(s): PROBNP in the last 8760 hours. HbA1C: No results for input(s): HGBA1C in the last 72 hours. CBG: Recent Labs  Lab 09/27/17 0107  GLUCAP 149*   Lipid Profile: No results for input(s): CHOL, HDL, LDLCALC, TRIG, CHOLHDL, LDLDIRECT in the last 72 hours. Thyroid Function Tests: No results for input(s): TSH, T4TOTAL, FREET4, T3FREE, THYROIDAB in the last 72 hours. Anemia Panel: No results for input(s): VITAMINB12, FOLATE, FERRITIN, TIBC, IRON, RETICCTPCT in the last 72 hours. Urine analysis: No results found for: COLORURINE, APPEARANCEUR, LABSPEC, PHURINE, GLUCOSEU, HGBUR, BILIRUBINUR, KETONESUR, PROTEINUR, UROBILINOGEN, NITRITE, LEUKOCYTESUR  Creatinine Clearance: Estimated Creatinine Clearance: 66.4 mL/min (A) (by C-G formula based on SCr of 1.54 mg/dL (H)).  Sepsis Labs: @LABRCNTIP (procalcitonin:4,lacticidven:4) )No results found for this or any previous visit (from the past 240 hour(s)).   Radiological Exams on Admission: Dg Chest 2 View  Result Date: 09/26/2017 CLINICAL DATA:  Cough and fever EXAM: CHEST  2 VIEW COMPARISON:  November 14, 2016 FINDINGS: There is no edema or consolidation. There is cardiomegaly with pulmonary venous hypertension. No adenopathy. There is aortic atherosclerosis. No evident bone lesions. IMPRESSION: Pulmonary vascular congestion without edema or consolidation. There is aortic atherosclerosis. There is mild elevation of the left hemidiaphragm. Aortic Atherosclerosis (ICD10-I70.0). Electronically Signed   By: Bretta Bang III  M.D.   On: 09/26/2017 19:14   Ct Abdomen Pelvis W Contrast  Result Date: 09/27/2017 CLINICAL DATA:  Abdominal distension. Intermittent right upper quadrant abdominal pain for 1 year. EXAM: CT ABDOMEN AND PELVIS WITH CONTRAST TECHNIQUE: Multidetector CT imaging of the abdomen and pelvis was performed using the standard protocol following bolus administration of intravenous contrast. CONTRAST:  ISOVUE-300 IOPAMIDOL (ISOVUE-300) INJECTION 61% COMPARISON:  CT 11/14/2016 FINDINGS: Lower  chest: The heart is enlarged. Small left pleural effusion and adjacent atelectasis. Hepatobiliary: Nodular hepatic contours consistent with cirrhosis. No focal hepatic lesion. Clips in the gallbladder fossa postcholecystectomy. No biliary dilatation. Pancreas: No ductal dilatation or inflammation. Spleen: Splenomegaly, spleen measures 17.4 x 7.4 x 16.8 cm. No focal abnormality. Adrenals/Urinary Tract: No adrenal nodule. No hydronephrosis or perinephric edema. Homogeneous renal enhancement. Absent excretion on delayed phase imaging. 3.6 cm lesion in the upper right kidney has increased density from prior exam, unchanged in size 2.3 cm simple cyst in the lower right kidney. Urinary bladder is physiologically distended without wall thickening. Stomach/Bowel: Stomach is decompressed. No bowel obstruction. Presence of intra-abdominal ascites and lack of enteric contrast limits detailed bowel evaluation. No evidence of bowel wall thickening. Appendix is surgically absent. Vascular/Lymphatic: Aortic atherosclerosis. Shotty periportal nodes. Prominent upper retroperitoneal nodes are likely reactive. Reproductive: Prostate is unremarkable. Other: Moderate ascites throughout the abdomen, pelvis, and mesentery. No free air. No evidence of abscess. Ascites tracks into a small umbilical hernia. There is body wall edema. Musculoskeletal: Degenerative change in the spine. There are no acute or suspicious osseous abnormalities. IMPRESSION: 1.  Hepatic cirrhosis and splenomegaly. 2. Moderate abdominopelvic ascites.  Diffuse body wall edema. 3. Probable hemorrhagic cyst in the upper right kidney, unchanged in size over the past 10 months but increased in density. Consider confirmation with renal protocol MRI on an elective outpatient basis. 4. Absent renal excretion on delayed phase imaging suggest underlying renal dysfunction. 5.  Aortic Atherosclerosis (ICD10-I70.0). Electronically Signed   By: Rubye Oaks M.D.   On: 09/27/2017 01:06    EKG: Normal sinus rhythm Prolonged QT  Assessment/Plan Principal Problem:   Abdominal distension Active Problems:   Diabetes mellitus without complication (HCC)   Liver cirrhosis (HCC)   Abdominal pain   Dyspnea   CKD (chronic kidney disease)   Thrombocytopenia (HCC)   Renal cyst, right   Pulmonary vascular congestion   Hypoalbuminemia   Ascites   CHF (congestive heart failure) (HCC)   #1. Abdominal distention/ascites. Hx of same in setting of liver disease, possible non-compliance, hypoalbuminemia and fluid retention. CT abdomen reveal moderate ascites. Patient denies ever having had a paracentesis in the past.  Chart review indicates he has been admitted and provided with Lasix infusion at [facility]. Currently is afebrile hemodynamically stable and not hypoxic. He is provided with 80mg  Lasix in the emergency department as well as rocephin. Max temp 99 no leukocytosis -Admit to telemetry -Continue IV Lasix 80 mg twice a day -continue Rocephin -paracentesis per IR -hold plavix for now -npo until evaluated by IR -intake and output -daily weight  #2.cirrhosis/thrombocytopenia/hypalbum. History of same. Platelets 120, BUN 2.3. Primary gastroenterologist at the [facility]. Ammonia 54. Marland Kitchen Home medications include lactulose and xufaxab. Abdominal CT noted above -Continue lactulose and xifaxan -Obtain an INR -holdin plavix for now -Paracentesis as noted above  #3. Dyspnea/pulmonary vascular  congestion/with a history of CHF. Chart review indicates echo done last year with an EF of 50%. Chest x-ray as noted above. Likely related to above. Lasix started in the emergency department. -Continue Lasix as noted above -DuoNeb's -Monitor oxygen saturation level -Oxygen supplementation as indicated  #4. Chronic kidney disease stage III. Chart review indicates baseline creatinine 1.3. Creatinine at time of admission 1.5.likely related to above -Hold nephrotoxins as able -Monitor urine output -Recheck in the morning  #5. Diabetes. Home medications include long-acting insulin as well as sliding scale. Serum glucose on admission 197. -Continue Lantus at a lower dose  than home -Sliding scale insulin for optimal control -Obtain a hemoglobin A1c  #6. Hypertension. Fair control in the emergency department. Home medications include minipress and metoprolol -continue home meds with parameters -monitor   DVT prophylaxis: scd  Code Status: full  Family Communication: none present  Disposition Plan: home  Consults called: IR for paracentesis  Admission status: inpatient    Gwenyth Bender MD [facility]  If 7PM-7AM, please contact night-coverage [email or web address] Password [email or web address]  09/27/2017, 8:20 AM

## 2017-09-27 NOTE — Care Management Note (Signed)
Case Management Note  Patient Details  Name: Logan Cole MRN: [id number] Date of Birth: November 30, 1945  Subjective/Objective:    Abdominal Distention               Action/Plan: Patient lives at home with spouse, [facility] and [facility]; use a walker at home. CM will continue to follow for progression of care. Expected Discharge Date:   possibly 10/01/2017               Expected Discharge Plan:  Home w Home Health Services  Discharge planning Services  CM Consult  Status of Service:  In process, will continue to follow  Reola MosherChandler, Felicity Penix L, RN,MHA,BSN [phone] 09/27/2017, 10:13 AM

## 2017-09-27 NOTE — ED Provider Notes (Signed)
[facility] EMERGENCY DEPARTMENT Provider Note   CSN: [id number] Arrival date & time: 09/26/17  1750  Medical screening examination/treatment/procedure(s) were conducted as a shared visit with non-physician practitioner(s) and myself.  I personally evaluated the patient during the encounter.   EKG Interpretation None        History   Chief Complaint Chief Complaint  Patient presents with  . Abdominal Pain    HPI Logan Cole is a 72 y.o. male.  The history is provided by the patient.  Abdominal Pain   This is a recurrent problem. The current episode started yesterday. The problem occurs constantly. The problem has not changed since onset.The pain is associated with an unknown factor. The pain is located in the RUQ. Pertinent negatives include anorexia and fever. Associated symptoms comments: Edema and distention. Nothing aggravates the symptoms. Nothing relieves the symptoms. His past medical history does not include PUD.    Past Medical History:  Diagnosis Date  . CHF (congestive heart failure) (HCC)   . Diabetes mellitus without complication (HCC)   . Liver dysfunction   . Renal disorder     There are no active problems to display for this patient.   Past Surgical History:  Procedure Laterality Date  . APPENDECTOMY    . CHOLECYSTECTOMY         Home Medications    Prior to Admission medications   Medication Sig Start Date End Date Taking? Authorizing Provider  acetaminophen (TYLENOL) 500 MG tablet Take 500 mg by mouth every 6 (six) hours as needed for mild pain.   Yes [provider]  ammonium lactate (LAC-HYDRIN) 12 % lotion Apply 1 application topically daily as needed for dry skin.   Yes [provider]  capsaicin (ZOSTRIX) 0.025 % cream Apply 1 application topically 3 (three) times daily as needed (skin care).   Yes [provider]  chlorhexidine (PERIDEX) 0.12 % solution Use as directed 15 mLs in the mouth or  throat 2 (two) times daily as needed (mouth care).   Yes [provider]  clopidogrel (PLAVIX) 75 MG tablet Take 75 mg by mouth daily.   Yes [provider]  ferrous sulfate 325 (65 FE) MG tablet Take 325 mg by mouth daily.   Yes [provider]  FLUoxetine (PROZAC) 20 MG tablet Take 40 mg by mouth every morning.   Yes [provider]  folic acid (FOLVITE) 1 MG tablet Take 1 mg by mouth daily.   Yes [provider]  gabapentin (NEURONTIN) 300 MG capsule Take 300 mg by mouth 3 (three) times daily.   Yes [provider]  hydrOXYzine (ATARAX/VISTARIL) 50 MG tablet Take 50 mg by mouth 3 (three) times daily.   Yes [provider]  insulin aspart (NOVOLOG FLEXPEN) 100 UNIT/ML FlexPen Inject 1-50 Units into the skin 3 (three) times daily with meals.   Yes [provider]  insulin glargine (LANTUS) 100 unit/mL SOPN Inject 50 Units into the skin 2 (two) times daily.   Yes [provider]  ipratropium-albuterol (DUONEB) 0.5-2.5 (3) MG/3ML SOLN Take 3 mLs by nebulization every 6 (six) hours as needed (SOB).   Yes [provider]  loperamide (IMODIUM) 2 MG capsule Take 2 mg by mouth 4 (four) times daily as needed for diarrhea or loose stools.   Yes [provider]  loratadine (CLARITIN) 10 MG tablet Take 10 mg by mouth daily as needed for allergies.   Yes [provider]  metoprolol tartrate (LOPRESSOR)  25 MG tablet Take 12.5 mg by mouth 2 (two) times daily.   Yes [provider]  oxyCODONE (OXY IR/ROXICODONE) 5 MG immediate release tablet Take 10 mg by mouth every 4 (four) hours as needed for severe pain.   Yes [provider]  pantoprazole (PROTONIX) 40 MG tablet Take 40 mg by mouth daily as needed (acid reflux).   Yes [provider]  pilocarpine (SALAGEN) 5 MG tablet Take 5 mg by mouth 4 (four) times daily.   Yes [provider]  pravastatin (PRAVACHOL) 20 MG tablet  Take 20 mg by mouth at bedtime.   Yes [provider]  prazosin (MINIPRESS) 5 MG capsule Take 10 mg by mouth at bedtime.   Yes [provider]  QUEtiapine (SEROQUEL) 100 MG tablet Take 100 mg by mouth at bedtime.   Yes [provider]  rifaximin (XIFAXAN) 550 MG TABS tablet Take 550 mg by mouth 3 (three) times daily.   Yes [provider]  sodium fluoride (LURIDE) 1.1 (0.5 F) MG/ML SOLN Take 1 drop by mouth 2 (two) times daily as needed (mouth care).   Yes [provider]  zinc sulfate 220 (50 Zn) MG capsule Take 220 mg by mouth daily.   Yes [provider]    Family History No family history on file.  Social History Social History   Tobacco Use  . Smoking status: Never Smoker  . Smokeless tobacco: Never Used  Substance Use Topics  . Alcohol use: No    Frequency: Never  . Drug use: No     Allergies   Morphine and related   Review of Systems Review of Systems  Constitutional: Negative for fever.  Respiratory: Negative for shortness of breath.   Cardiovascular: Negative for chest pain.  Gastrointestinal: Positive for abdominal distention and abdominal pain. Negative for anorexia.  All other systems reviewed and are negative.    Physical Exam Updated Vital Signs BP (!) 144/70   Pulse 74   Temp 99 F (37.2 C) (Oral)   Resp 16   Ht 6' (1.829 m)   Wt (!) 154.2 kg (340 lb)   SpO2 95%   BMI 46.11 kg/m   Physical Exam  Constitutional: He is oriented to person, place, and time. He appears well-developed and well-nourished.  Non-toxic appearance.  HENT:  Head: Normocephalic and atraumatic.  Mouth/Throat: No oropharyngeal exudate.  Eyes: Conjunctivae are normal. Pupils are equal, round, and reactive to light.  Neck: Normal range of motion. Neck supple.  Cardiovascular: Normal rate, regular rhythm, normal heart sounds and intact distal pulses.  Pulmonary/Chest: Effort normal and breath sounds normal. No respiratory  distress.  Abdominal: Soft. Bowel sounds are normal. He exhibits shifting dullness, distension and fluid wave. There is no tenderness at McBurney's point and negative Murphy's sign.  Musculoskeletal: He exhibits no tenderness.  Neurological: He is alert and oriented to person, place, and time.  Skin: Skin is warm and dry. Capillary refill takes less than 2 seconds.  Psychiatric: He has a normal mood and affect.     ED Treatments / Results  Labs (all labs ordered are listed, but only abnormal results are displayed) Labs Reviewed  BASIC METABOLIC PANEL - Abnormal; Notable for the following components:      Result Value   Glucose, Bld 197 (*)    Creatinine, Ser 1.54 (*)    Calcium 7.9 (*)    GFR calc non Af Amer 43 (*)    GFR calc Af Amer 50 (*)  All other components within normal limits  CBC - Abnormal; Notable for the following components:   RBC 3.27 (*)    Hemoglobin 9.3 (*)    HCT 29.3 (*)    Platelets 120 (*)    All other components within normal limits  HEPATIC FUNCTION PANEL - Abnormal; Notable for the following components:   Albumin 2.3 (*)    ALT 14 (*)    All other components within normal limits  CBG MONITORING, ED - Abnormal; Notable for the following components:   Glucose-Capillary 149 (*)    All other components within normal limits  BRAIN NATRIURETIC PEPTIDE  I-STAT TROPONIN, ED    EKG  EKG Interpretation None       Radiology Dg Chest 2 View  Result Date: 09/26/2017 CLINICAL DATA:  Cough and fever EXAM: CHEST  2 VIEW COMPARISON:  Godric Lavell 9, 2018 FINDINGS: There is no edema or consolidation. There is cardiomegaly with pulmonary venous hypertension. No adenopathy. There is aortic atherosclerosis. No evident bone lesions. IMPRESSION: Pulmonary vascular congestion without edema or consolidation. There is aortic atherosclerosis. There is mild elevation of the left hemidiaphragm. Aortic Atherosclerosis (ICD10-I70.0). Electronically Signed   By: Bretta BangWilliam  Woodruff  III M.D.   On: 09/26/2017 19:14   Ct Abdomen Pelvis W Contrast  Result Date: 09/27/2017 CLINICAL DATA:  Abdominal distension. Intermittent right upper quadrant abdominal pain for 1 year. EXAM: CT ABDOMEN AND PELVIS WITH CONTRAST TECHNIQUE: Multidetector CT imaging of the abdomen and pelvis was performed using the standard protocol following bolus administration of intravenous contrast. CONTRAST:  100mL ISOVUE-300 IOPAMIDOL (ISOVUE-300) INJECTION 61% COMPARISON:  CT 11/14/2016 FINDINGS: Lower chest: The heart is enlarged. Small left pleural effusion and adjacent atelectasis. Hepatobiliary: Nodular hepatic contours consistent with cirrhosis. No focal hepatic lesion. Clips in the gallbladder fossa postcholecystectomy. No biliary dilatation. Pancreas: No ductal dilatation or inflammation. Spleen: Splenomegaly, spleen measures 17.4 x 7.4 x 16.8 cm. No focal abnormality. Adrenals/Urinary Tract: No adrenal nodule. No hydronephrosis or perinephric edema. Homogeneous renal enhancement. Absent excretion on delayed phase imaging. 3.6 cm lesion in the upper right kidney has increased density from prior exam, unchanged in size 2.3 cm simple cyst in the lower right kidney. Urinary bladder is physiologically distended without wall thickening. Stomach/Bowel: Stomach is decompressed. No bowel obstruction. Presence of intra-abdominal ascites and lack of enteric contrast limits detailed bowel evaluation. No evidence of bowel wall thickening. Appendix is surgically absent. Vascular/Lymphatic: Aortic atherosclerosis. Shotty periportal nodes. Prominent upper retroperitoneal nodes are likely reactive. Reproductive: Prostate is unremarkable. Other: Moderate ascites throughout the abdomen, pelvis, and mesentery. No free air. No evidence of abscess. Ascites tracks into a small umbilical hernia. There is body wall edema. Musculoskeletal: Degenerative change in the spine. There are no acute or suspicious osseous abnormalities. IMPRESSION:  1. Hepatic cirrhosis and splenomegaly. 2. Moderate abdominopelvic ascites.  Diffuse body wall edema. 3. Probable hemorrhagic cyst in the upper right kidney, unchanged in size over the past 10 months but increased in density. Consider confirmation with renal protocol MRI on an elective outpatient basis. 4. Absent renal excretion on delayed phase imaging suggest underlying renal dysfunction. 5.  Aortic Atherosclerosis (ICD10-I70.0). Electronically Signed   By: Rubye OaksMelanie  Ehinger M.D.   On: 09/27/2017 01:06    Procedures Procedures (including critical care time)  Medications Ordered in ED Medications  furosemide (LASIX) injection 40 mg (not administered)  HYDROmorphone (DILAUDID) injection 1 mg (1 mg Intravenous Given 09/27/17 0014)  ondansetron (ZOFRAN) injection 4 mg (4 mg Intravenous Given 09/27/17 0013)  furosemide (LASIX) injection 40 mg (40 mg Intravenous Given 09/27/17 0013)  iopamidol (ISOVUE-300) 61 % injection (100 mLs  Contrast Given 09/27/17 0036)      Final Clinical Impressions(s) / ED Diagnoses   Final diagnoses:  Other ascites  Anasarca    Admit to medicine    Kaylie Ritter, MD 09/27/17 4098

## 2017-09-27 NOTE — ED Notes (Signed)
Patient transported to CT 

## 2017-09-28 ENCOUNTER — Inpatient Hospital Stay (HOSPITAL_COMMUNITY): Payer: Non-veteran care

## 2017-09-28 DIAGNOSIS — R0602 Shortness of breath: Secondary | ICD-10-CM

## 2017-09-28 DIAGNOSIS — E8809 Other disorders of plasma-protein metabolism, not elsewhere classified: Secondary | ICD-10-CM

## 2017-09-28 DIAGNOSIS — E119 Type 2 diabetes mellitus without complications: Secondary | ICD-10-CM

## 2017-09-28 LAB — COMPREHENSIVE METABOLIC PANEL
ALBUMIN: 2.2 g/dL — AB (ref 3.5–5.0)
ALK PHOS: 61 U/L (ref 38–126)
ALT: 14 U/L — ABNORMAL LOW (ref 17–63)
ANION GAP: 12 (ref 5–15)
AST: 28 U/L (ref 15–41)
BUN: 15 mg/dL (ref 6–20)
CO2: 29 mmol/L (ref 22–32)
Calcium: 7.9 mg/dL — ABNORMAL LOW (ref 8.9–10.3)
Chloride: 96 mmol/L — ABNORMAL LOW (ref 101–111)
Creatinine, Ser: 1.89 mg/dL — ABNORMAL HIGH (ref 0.61–1.24)
GFR calc Af Amer: 39 mL/min — ABNORMAL LOW (ref >60)
GFR calc non Af Amer: 34 mL/min — ABNORMAL LOW (ref >60)
GLUCOSE: 173 mg/dL — AB (ref 65–99)
POTASSIUM: 3.4 mmol/L — AB (ref 3.5–5.1)
SODIUM: 137 mmol/L (ref 135–145)
Total Bilirubin: 0.9 mg/dL (ref 0.3–1.2)
Total Protein: 6 g/dL — ABNORMAL LOW (ref 6.5–8.1)

## 2017-09-28 LAB — CBC
HCT: 25.8 % — ABNORMAL LOW (ref 39.0–52.0)
HEMOGLOBIN: 8.3 g/dL — AB (ref 13.0–17.0)
MCH: 29.2 pg (ref 26.0–34.0)
MCHC: 32.2 g/dL (ref 30.0–36.0)
MCV: 90.8 fL (ref 78.0–100.0)
Platelets: 101 10*3/uL — ABNORMAL LOW (ref 150–400)
RBC: 2.84 MIL/uL — ABNORMAL LOW (ref 4.22–5.81)
RDW: 15 % (ref 11.5–15.5)
WBC: 2.9 10*3/uL — ABNORMAL LOW (ref 4.0–10.5)

## 2017-09-28 LAB — GLUCOSE, CAPILLARY
GLUCOSE-CAPILLARY: 177 mg/dL — AB (ref 65–99)
GLUCOSE-CAPILLARY: 148 mg/dL — AB (ref 65–99)
Glucose-Capillary: 131 mg/dL — ABNORMAL HIGH (ref 65–99)
Glucose-Capillary: 158 mg/dL — ABNORMAL HIGH (ref 65–99)

## 2017-09-28 LAB — ECHOCARDIOGRAM COMPLETE
HEIGHTINCHES: 72 in
Weight: 5814.4 oz

## 2017-09-28 LAB — HCV INTERPRETATION

## 2017-09-28 LAB — HCV AB W REFLEX TO QUANT PCR: HCV Ab: <0.1 s/co ratio (ref 0.0–0.9)

## 2017-09-28 MED ORDER — IPRATROPIUM-ALBUTEROL 0.5-2.5 (3) MG/3ML IN SOLN
3.0000 mL | Freq: Four times a day (QID) | RESPIRATORY_TRACT | Status: DC | PRN
  Administered 2017-09-29: 3 mL via RESPIRATORY_TRACT
  Filled 2017-09-28: qty 3

## 2017-09-28 MED ORDER — FUROSEMIDE 10 MG/ML IJ SOLN
40.0000 mg | Freq: Two times a day (BID) | INTRAMUSCULAR | Status: DC
  Administered 2017-09-28 – 2017-09-30 (×5): 40 mg via INTRAVENOUS
  Filled 2017-09-28 (×5): qty 4

## 2017-09-28 MED ORDER — FLUTICASONE PROPIONATE 50 MCG/ACT NA SUSP
2.0000 | Freq: Two times a day (BID) | NASAL | Status: DC
  Administered 2017-09-28 – 2017-10-01 (×6): 2 via NASAL
  Filled 2017-09-28: qty 16

## 2017-09-28 MED ORDER — SPIRONOLACTONE 25 MG PO TABS
25.0000 mg | ORAL_TABLET | Freq: Two times a day (BID) | ORAL | Status: DC
  Administered 2017-09-28 – 2017-10-01 (×7): 25 mg via ORAL
  Filled 2017-09-28 (×7): qty 1

## 2017-09-28 NOTE — Progress Notes (Signed)
  Echocardiogram 2D Echocardiogram has been performed.  Janalyn HarderWest, Luree Palla R 09/28/2017, 9:08 AM

## 2017-09-28 NOTE — Progress Notes (Signed)
MD said to hold metoprolol.

## 2017-09-28 NOTE — Progress Notes (Signed)
MD notified of blood pressure of 109/32. Asked if I should hold metoprolol.

## 2017-09-28 NOTE — Progress Notes (Signed)
PROGRESS NOTE [facility]   Jaymeson Mengel   ZOX:[id number] DOB: 1946/08/06  DOA: 09/26/2017 PCP: Patient, No Pcp Per   Brief Narrative:  Mohmed Farver is a 72 year old male with past medical history of DM, CKD, hepatic dysfunction,, morbid obesity, and diastolic CHF who presented to the emergency department complaining of abdominal pain and distention associated symptoms of shortness of breath.  Patient was treated in the ER with IV Lasix with improvement of symptoms.  CT of the abdomen showed ascites and liver cirrhosis and he was given Rocephin.  Patient was admitted with working diagnosis of possible CHF exacerbation versus anasarca from liver diseases.   Subjective: Patient seen and examined, he reported feeling slight better but still very edematous.  His shortness of breath has resolved.   Assessment & Plan: Dyspnea - improved  Chest x-ray consistent with increasing vascular congestion, BNP low but patient morbid obesity therefore could be false negative.  Patient improved after Lasix were administered.  Echo shows EF of 60-65%, no mention of diastolic function but given right ventricle dilation likely to have some degree of diastolic dysfunction.  We will continue Lasix treatment for now, will add Aldactone given hepatic cirrhosis.   Anasarca Likely from liver disease, and hypoproteinemia Continue Lasix and Aldactone, could consider 1 dose of albumin to help with diuresis  Acute on chronic diastolic CHF  Initial presentation consistent with CHF Continue Lasix 40 mg IV twice daily, monitor maintaining ECHO EF 60-65%, ?  Diastolic dysfunction Daily weights and strict I&O's  Liver cirrhosis Hepatitis panel negative Patient sees GI at [facility] Ammonia levels trending down, continue lactulose and Xifaxan Pancytopenia likely related to liver disease and splenomegaly Initially treated for SBP but no signs for infection IR evaluated for paracentesis but no able to obtain  fluid  CKD stage III Apparent baseline creatinine around 1.3 Increase in creatinine due to Lasix, Hepatorenal syndrome may need to add albumin in a.m. Continue to monitor BMP  Diabetes type 2 CBGs stable Continue current management  Hypertension BP soft Hold metoprolol for now Continue to monitor  DVT prophylaxis: SCDs Code Status: Full code Family Communication: None at bedside Disposition Plan: Home when medically stable suspect 1-2 days  Consultants:   None  Procedures:   Echocardiogram  Antimicrobials: Anti-infectives (From admission, onward)   Start     Dose/Rate Route Frequency Ordered Stop   09/27/17 1000  rifaximin (XIFAXAN) tablet 550 mg     550 mg Oral 3 times daily 09/27/17 0305     09/27/17 1000  cefTRIAXone (ROCEPHIN) 2 g in sodium chloride 0.9 % 100 mL IVPB  Status:  Discontinued     2 g 200 mL/hr over 30 Minutes Intravenous Every 24 hours 09/27/17 0656 09/27/17 1318   09/27/17 0245  cefTRIAXone (ROCEPHIN) 1 g in sodium chloride 0.9 % 100 mL IVPB     1 g 200 mL/hr over 30 Minutes Intravenous  Once 09/27/17 0238 09/27/17 0443       Objective: Vitals:   09/28/17 0434 09/28/17 0500 09/28/17 0909 09/28/17 1510  BP: (!) 140/55  (!) 109/32 (!) 108/53  Pulse: 75  65 74  Resp: 18   18  Temp: 98.3 F (36.8 C)   98.5 F (36.9 C)  TempSrc: Oral   Oral  SpO2: 95%   92%  Weight:  (!) 164.8 kg (363 lb 6.4 oz)    Height:        Intake/Output Summary (Last 24 hours) at 09/28/2017 1706 Last data filed at  09/28/2017 0055 Gross per 24 hour  Intake 0 ml  Output 1400 ml  Net -1400 ml   Filed Weights   09/26/17 1812 09/28/17 0500  Weight: (!) 154.2 kg (340 lb) (!) 164.8 kg (363 lb 6.4 oz)    Examination:  General exam: NAD HEENT: OP moist and clear Respiratory system: Good air entry, mild bibasilar crackles.  Upper airway in the area of the throat with forced expiratory wheezing.  Cardiovascular system: S1 & S2 heard, RRR.  Positive systolic murmur,  no JVD Gastrointestinal system: Obese, unable to identify fluid wave, positive bowel sounds, right upper quadrant tenderness, mild edema Central nervous system: Alert and oriented. No focal neurological deficits. Extremities: Bilateral lower extremity edema 2+ up to thighs Skin: Multiple ecchymosis Psychiatry: Mood & affect appropriate.    Data Reviewed: I have personally reviewed following labs and imaging studies  CBC: Recent Labs  Lab 09/26/17 1820 09/28/17 0345  WBC 4.0 2.9*  HGB 9.3* 8.3*  HCT 29.3* 25.8*  MCV 89.6 90.8  PLT 120* 101*   Basic Metabolic Panel: Recent Labs  Lab 09/26/17 1820 09/28/17 0345  NA 138 137  K 3.6 3.4*  CL 102 96*  CO2 26 29  GLUCOSE 197* 173*  BUN 13 15  CREATININE 1.54* 1.89*  CALCIUM 7.9* 7.9*   GFR: Estimated Creatinine Clearance: 56.2 mL/min (A) (by C-G formula based on SCr of 1.89 mg/dL (H)). Liver Function Tests: Recent Labs  Lab 09/26/17 1820 09/28/17 0345  AST 31 28  ALT 14* 14*  ALKPHOS 63 61  BILITOT 0.9 0.9  PROT 6.5 6.0*  ALBUMIN 2.3* 2.2*   No results for input(s): LIPASE, AMYLASE in the last 168 hours. Recent Labs  Lab 09/27/17 0306  AMMONIA 45*   Coagulation Profile: Recent Labs  Lab 09/27/17 1848  INR 1.50   Cardiac Enzymes: No results for input(s): CKTOTAL, CKMB, CKMBINDEX, TROPONINI in the last 168 hours. BNP (last 3 results) No results for input(s): PROBNP in the last 8760 hours. HbA1C: Recent Labs    09/27/17 0738  HGBA1C 6.4*   CBG: Recent Labs  Lab 09/27/17 1236 09/27/17 1706 09/27/17 2203 09/28/17 0816 09/28/17 1213  GLUCAP 134* 151* 170* 131* 177*   Lipid Profile: No results for input(s): CHOL, HDL, LDLCALC, TRIG, CHOLHDL, LDLDIRECT in the last 72 hours. Thyroid Function Tests: No results for input(s): TSH, T4TOTAL, FREET4, T3FREE, THYROIDAB in the last 72 hours. Anemia Panel: No results for input(s): VITAMINB12, FOLATE, FERRITIN, TIBC, IRON, RETICCTPCT in the last 72  hours. Sepsis Labs: No results for input(s): PROCALCITON, LATICACIDVEN in the last 168 hours.  No results found for this or any previous visit (from the past 240 hour(s)).    Radiology Studies: Dg Chest 2 View  Result Date: 09/26/2017 CLINICAL DATA:  Cough and fever EXAM: CHEST  2 VIEW COMPARISON:  November 14, 2016 FINDINGS: There is no edema or consolidation. There is cardiomegaly with pulmonary venous hypertension. No adenopathy. There is aortic atherosclerosis. No evident bone lesions. IMPRESSION: Pulmonary vascular congestion without edema or consolidation. There is aortic atherosclerosis. There is mild elevation of the left hemidiaphragm. Aortic Atherosclerosis (ICD10-I70.0). Electronically Signed   By: Bretta Bang III M.D.   On: 09/26/2017 19:14   Ct Abdomen Pelvis W Contrast  Result Date: 09/27/2017 CLINICAL DATA:  Abdominal distension. Intermittent right upper quadrant abdominal pain for 1 year. EXAM: CT ABDOMEN AND PELVIS WITH CONTRAST TECHNIQUE: Multidetector CT imaging of the abdomen and pelvis was performed using the standard protocol following  bolus administration of intravenous contrast. CONTRAST:  100mL ISOVUE-300 IOPAMIDOL (ISOVUE-300) INJECTION 61% COMPARISON:  CT 11/14/2016 FINDINGS: Lower chest: The heart is enlarged. Small left pleural effusion and adjacent atelectasis. Hepatobiliary: Nodular hepatic contours consistent with cirrhosis. No focal hepatic lesion. Clips in the gallbladder fossa postcholecystectomy. No biliary dilatation. Pancreas: No ductal dilatation or inflammation. Spleen: Splenomegaly, spleen measures 17.4 x 7.4 x 16.8 cm. No focal abnormality. Adrenals/Urinary Tract: No adrenal nodule. No hydronephrosis or perinephric edema. Homogeneous renal enhancement. Absent excretion on delayed phase imaging. 3.6 cm lesion in the upper right kidney has increased density from prior exam, unchanged in size 2.3 cm simple cyst in the lower right kidney. Urinary bladder is  physiologically distended without wall thickening. Stomach/Bowel: Stomach is decompressed. No bowel obstruction. Presence of intra-abdominal ascites and lack of enteric contrast limits detailed bowel evaluation. No evidence of bowel wall thickening. Appendix is surgically absent. Vascular/Lymphatic: Aortic atherosclerosis. Shotty periportal nodes. Prominent upper retroperitoneal nodes are likely reactive. Reproductive: Prostate is unremarkable. Other: Moderate ascites throughout the abdomen, pelvis, and mesentery. No free air. No evidence of abscess. Ascites tracks into a small umbilical hernia. There is body wall edema. Musculoskeletal: Degenerative change in the spine. There are no acute or suspicious osseous abnormalities. IMPRESSION: 1. Hepatic cirrhosis and splenomegaly. 2. Moderate abdominopelvic ascites.  Diffuse body wall edema. 3. Probable hemorrhagic cyst in the upper right kidney, unchanged in size over the past 10 months but increased in density. Consider confirmation with renal protocol MRI on an elective outpatient basis. 4. Absent renal excretion on delayed phase imaging suggest underlying renal dysfunction. 5.  Aortic Atherosclerosis (ICD10-I70.0). Electronically Signed   By: Rubye OaksMelanie  Ehinger M.D.   On: 09/27/2017 01:06   Ir Abdomen [place] Limited  Result Date: 09/27/2017 CLINICAL DATA:  Evaluate for ascites and possible paracentesis. EXAM: LIMITED ABDOMEN ULTRASOUND FOR ASCITES TECHNIQUE: Limited ultrasound survey for ascites was performed in all four abdominal quadrants. COMPARISON:  CT 09/27/2017 FINDINGS: No significant ascites is present at this time. Trace fluid in the abdominal cavity. IMPRESSION: No significant ascites.  Paracentesis not performed. Electronically Signed   By: Richarda OverlieAdam  Henn M.D.   On: 09/27/2017 12:03      Scheduled Meds: . FLUoxetine  40 mg Oral Daily  . folic acid  1 mg Oral Daily  . furosemide  40 mg Intravenous Q12H  . gabapentin  300 mg Oral TID  . hydrOXYzine  50  mg Oral TID  . insulin aspart  0-15 Units Subcutaneous TID WC  . insulin aspart  0-5 Units Subcutaneous QHS  . insulin glargine  20 Units Subcutaneous QHS  . lactulose  20 g Oral BID  . metoprolol tartrate  12.5 mg Oral BID  . pilocarpine  5 mg Oral TID AC & HS  . prazosin  10 mg Oral QHS  . QUEtiapine  100 mg Oral QHS  . rifaximin  550 mg Oral TID  . sodium chloride flush  3 mL Intravenous Q12H  . spironolactone  25 mg Oral BID  . zinc sulfate  220 mg Oral Daily   Continuous Infusions: . sodium chloride       LOS: 1 day    Time spent: Total of 35 minutes spent with pt, greater than 50% of which was spent in discussion of  treatment, counseling and coordination of care   Latrelle DodrillEdwin Silva, MD Pager: Text Page via [email or web address]   If 7PM-7AM, please contact night-coverage [email or web address] 09/28/2017, 5:06 PM   Note - This record has been created  using Editor, commissioning. Chart creation errors have been sought, but may not always have been located. Such creation errors do not reflect on the standard of medical care.

## 2017-09-29 ENCOUNTER — Encounter (HOSPITAL_COMMUNITY): Payer: Self-pay | Admitting: Internal Medicine

## 2017-09-29 DIAGNOSIS — IMO0002 Reserved for concepts with insufficient information to code with codable children: Secondary | ICD-10-CM

## 2017-09-29 DIAGNOSIS — R0989 Other specified symptoms and signs involving the circulatory and respiratory systems: Secondary | ICD-10-CM

## 2017-09-29 DIAGNOSIS — E1365 Other specified diabetes mellitus with hyperglycemia: Secondary | ICD-10-CM

## 2017-09-29 DIAGNOSIS — N182 Chronic kidney disease, stage 2 (mild): Secondary | ICD-10-CM

## 2017-09-29 DIAGNOSIS — Z6841 Body Mass Index (BMI) 40.0 and over, adult: Secondary | ICD-10-CM

## 2017-09-29 DIAGNOSIS — E1322 Other specified diabetes mellitus with diabetic chronic kidney disease: Secondary | ICD-10-CM

## 2017-09-29 DIAGNOSIS — E876 Hypokalemia: Secondary | ICD-10-CM

## 2017-09-29 DIAGNOSIS — I5033 Acute on chronic diastolic (congestive) heart failure: Secondary | ICD-10-CM | POA: Diagnosis present

## 2017-09-29 DIAGNOSIS — R1011 Right upper quadrant pain: Secondary | ICD-10-CM

## 2017-09-29 DIAGNOSIS — N179 Acute kidney failure, unspecified: Secondary | ICD-10-CM

## 2017-09-29 HISTORY — DX: Morbid (severe) obesity due to excess calories: E66.01

## 2017-09-29 LAB — COMPREHENSIVE METABOLIC PANEL
ALT: 13 U/L — AB (ref 17–63)
AST: 32 U/L (ref 15–41)
Albumin: 2.1 g/dL — ABNORMAL LOW (ref 3.5–5.0)
Alkaline Phosphatase: 52 U/L (ref 38–126)
Anion gap: 10 (ref 5–15)
BUN: 17 mg/dL (ref 6–20)
CHLORIDE: 98 mmol/L — AB (ref 101–111)
CO2: 28 mmol/L (ref 22–32)
CREATININE: 1.83 mg/dL — AB (ref 0.61–1.24)
Calcium: 7.8 mg/dL — ABNORMAL LOW (ref 8.9–10.3)
GFR calc non Af Amer: 35 mL/min — ABNORMAL LOW (ref >60)
GFR, EST AFRICAN AMERICAN: 41 mL/min — AB (ref >60)
Glucose, Bld: 142 mg/dL — ABNORMAL HIGH (ref 65–99)
Potassium: 3.2 mmol/L — ABNORMAL LOW (ref 3.5–5.1)
SODIUM: 136 mmol/L (ref 135–145)
Total Bilirubin: 0.8 mg/dL (ref 0.3–1.2)
Total Protein: 5.7 g/dL — ABNORMAL LOW (ref 6.5–8.1)

## 2017-09-29 LAB — CBC WITH DIFFERENTIAL/PLATELET
Basophils Absolute: 0 10*3/uL (ref 0.0–0.1)
Basophils Relative: 0 %
EOS ABS: 0.1 10*3/uL (ref 0.0–0.7)
Eosinophils Relative: 3 %
HCT: 24.5 % — ABNORMAL LOW (ref 39.0–52.0)
HEMOGLOBIN: 7.9 g/dL — AB (ref 13.0–17.0)
LYMPHS ABS: 0.7 10*3/uL (ref 0.7–4.0)
LYMPHS PCT: 29 %
MCH: 29.4 pg (ref 26.0–34.0)
MCHC: 32.2 g/dL (ref 30.0–36.0)
MCV: 91.1 fL (ref 78.0–100.0)
MONOS PCT: 10 %
Monocytes Absolute: 0.3 10*3/uL (ref 0.1–1.0)
NEUTROS PCT: 58 %
Neutro Abs: 1.4 10*3/uL — ABNORMAL LOW (ref 1.7–7.7)
Platelets: 89 10*3/uL — ABNORMAL LOW (ref 150–400)
RBC: 2.69 MIL/uL — ABNORMAL LOW (ref 4.22–5.81)
RDW: 15.1 % (ref 11.5–15.5)
WBC: 2.5 10*3/uL — ABNORMAL LOW (ref 4.0–10.5)

## 2017-09-29 LAB — GLUCOSE, CAPILLARY
GLUCOSE-CAPILLARY: 134 mg/dL — AB (ref 65–99)
Glucose-Capillary: 166 mg/dL — ABNORMAL HIGH (ref 65–99)
Glucose-Capillary: 160 mg/dL — ABNORMAL HIGH (ref 65–99)
Glucose-Capillary: 165 mg/dL — ABNORMAL HIGH (ref 65–99)

## 2017-09-29 MED ORDER — OXYCODONE HCL 5 MG PO TABS
5.0000 mg | ORAL_TABLET | ORAL | Status: DC | PRN
  Administered 2017-09-29 – 2017-10-01 (×4): 5 mg via ORAL
  Filled 2017-09-29 (×4): qty 1

## 2017-09-29 MED ORDER — LACTULOSE 10 GM/15ML PO SOLN
30.0000 g | Freq: Three times a day (TID) | ORAL | Status: DC
  Administered 2017-09-29 – 2017-10-01 (×6): 30 g via ORAL
  Filled 2017-09-29 (×6): qty 45

## 2017-09-29 MED ORDER — INFLUENZA VAC SPLIT HIGH-DOSE 0.5 ML IM SUSY
0.5000 mL | PREFILLED_SYRINGE | INTRAMUSCULAR | Status: DC
  Filled 2017-09-29: qty 0.5

## 2017-09-29 MED ORDER — ENOXAPARIN SODIUM 80 MG/0.8ML ~~LOC~~ SOLN
80.0000 mg | SUBCUTANEOUS | Status: DC
  Administered 2017-09-29 – 2017-09-30 (×2): 80 mg via SUBCUTANEOUS
  Filled 2017-09-29 (×2): qty 0.8

## 2017-09-29 MED ORDER — PNEUMOCOCCAL VAC POLYVALENT 25 MCG/0.5ML IJ INJ
0.5000 mL | INJECTION | INTRAMUSCULAR | Status: DC
  Filled 2017-09-29: qty 0.5

## 2017-09-29 MED ORDER — GABAPENTIN 100 MG PO CAPS
100.0000 mg | ORAL_CAPSULE | Freq: Three times a day (TID) | ORAL | Status: DC
  Administered 2017-09-29 – 2017-10-01 (×6): 100 mg via ORAL
  Filled 2017-09-29 (×6): qty 1

## 2017-09-29 MED ORDER — POTASSIUM CHLORIDE CRYS ER 20 MEQ PO TBCR
40.0000 meq | EXTENDED_RELEASE_TABLET | Freq: Once | ORAL | Status: AC
  Administered 2017-09-29: 40 meq via ORAL
  Filled 2017-09-29: qty 2

## 2017-09-29 NOTE — Progress Notes (Addendum)
Progress Note    Logan Cole  ZOX:[id number] DOB: [birth year]  DOA: 09/26/2017 PCP: Patient, No Pcp Per    Brief Narrative:   Chief complaint: Follow-up CHF exacerbation  Medical records reviewed and are as summarized below:  Logan Cole is an 72 y.o. male with PMH of diabetes, CKD, hepatic dysfunction, morbid obesity and chronic diastolic CHF who was admitted 03/18/90 for evaluation of abdominal pain and distention associated with shortness of breath. Received Lasix in the ED with some improvement. CT of the abdomen showed liver cirrhosis.  Assessment/Plan:   Principal Problem:   Acute on chronic diastolic CHF, anasarca in the setting of cirrhosis of the liver with diuresis complicated by AKI Patient's initial presentation was consistent with decompensated CHF. Chest x-ray showed increased vascular congestion. He is being diuresed. 2-D echo done 09/28/17 and showed an EF of 60-65 percent. There was associated mild to moderate increased PA peak pressures. Unfortunately, strict I/O has not been recorded in the patient's weight is up this morning. Continue Lasix/Aldactone. Creatinine up but remains volume overloaded. May need to reduce diuretics if trend continues.  Active Problems:   Hepatic encephalopathy Patient appears encephalopathic on exam today. His ammonia has been elevated. Will increase lactulose. Will decrease oxycodone and Neurontin and discontinue Atarax for now given encephalopathy.     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 CBGs 131-177. Hemoglobin A1c 6.4% indicating excellent outpatient control. Has underlying stage II-III CKD with a bump in his creatinine associated with diuresis. Monitor creatinine closely with diuresis.    Liver cirrhosis (HCC) with associated pancytopenia, hypoalbuminemia, thrombocytopenia  Hepatitis panel  negative. Continue lactulose and Xifaxan. IR evaluated the patient to perform paracentesis but no fluid drainable. Initially treated for SBP but no signs for this.    Hypokalemia Secondary to diuresis. Will give 40 mEq of potassium today but not routinely supplement given the addition of spironolactone to his regimen.    Morbid obesity Body mass index is 50.11 kg/m.   Family Communication/Anticipated D/C date and plan/Code Status   DVT prophylaxis: Lovenox ordered. Code Status: Full Code.  Family Communication: No family at the bedside. Disposition Plan: Home when stable.   Medical Consultants:    None.   Anti-Infectives:    Rocephin 09/27/17--->09/27/17   Subjective:   [name] is somnolent. He tells me he feels "terrible" and that he is having right sided abdominal discomfort. He falls asleep in between sentences.  Objective:    Vitals:   09/28/17 2121 09/29/17 0432 09/29/17 0530 09/29/17 0848  BP: (!) 150/41  (!) 99/55 (!) 120/58  Pulse: 73  75 72  Resp: 18  18 16   Temp: 98 F (36.7 C)  98.2 F (36.8 C) 98.2 F (36.8 C)  TempSrc:   Oral Oral  SpO2: 94%  96%   Weight:  (!) 167.6 kg (369 lb 7.9 oz)    Height:        Intake/Output Summary (Last 24 hours) at 09/29/2017 1124 Last data filed at 09/29/2017 0950 Gross per 24 hour  Intake 480 ml  Output -  Net 480 ml   Filed Weights   09/26/17 1812 09/28/17 0500 09/29/17 0432  Weight: (!) 154.2 kg (340 lb) (!) 164.8 kg (363 lb 6.4 oz) (!) 167.6 kg (369 lb 7.9 oz)    Exam: General: Encephalopathic with myoclonic jerking spells. Cardiovascular: Heart sounds show  a regular rate, and rhythm. No gallops or rubs. No murmurs. No JVD. Lungs: Diminished with poor air movment. No rales, rhonchi or wheezes. Abdomen: Soft/obese, nontender, nondistended with normal active bowel sounds. No masses. No hepatosplenomegaly. Neurological: Somnolent, unable to fully assess. Myoclonic jerking. Extremities: No clubbing or  cyanosis. 2+ edema. Pedal pulses 2+. Psychiatric: Mood and affect are flat. Insight and judgment are poor.   Data Reviewed:   I have personally reviewed following labs and imaging studies:  Labs: Labs show the following:   Basic Metabolic Panel: Recent Labs  Lab 09/26/17 1820 09/28/17 0345 09/29/17 0758  NA 138 137 136  K 3.6 3.4* 3.2*  CL 102 96* 98*  CO2 26 29 28   GLUCOSE 197* 173* 142*  BUN 13 15 17   CREATININE 1.54* 1.89* 1.83*  CALCIUM 7.9* 7.9* 7.8*   GFR Estimated Creatinine Clearance: 58.6 mL/min (A) (by C-G formula based on SCr of 1.83 mg/dL (H)). Liver Function Tests: Recent Labs  Lab 09/26/17 1820 09/28/17 0345 09/29/17 0758  AST 31 28 32  ALT 14* 14* 13*  ALKPHOS 63 61 52  BILITOT 0.9 0.9 0.8  PROT 6.5 6.0* 5.7*  ALBUMIN 2.3* 2.2* 2.1*    Recent Labs  Lab 09/27/17 0306  AMMONIA 45*   Coagulation profile Recent Labs  Lab 09/27/17 1848  INR 1.50    CBC: Recent Labs  Lab 09/26/17 1820 09/28/17 0345 09/29/17 0758  WBC 4.0 2.9* 2.5*  NEUTROABS  --   --  1.4*  HGB 9.3* 8.3* 7.9*  HCT 29.3* 25.8* 24.5*  MCV 89.6 90.8 91.1  PLT 120* 101* 89*   CBG: Recent Labs  Lab 09/28/17 0816 09/28/17 1213 09/28/17 1736 09/28/17 2117 09/29/17 0815  GLUCAP 131* 177* 158* 148* 134*   Hgb A1c: Recent Labs    09/27/17 0738  HGBA1C 6.4*   Microbiology No results found for this or any previous visit (from the past 240 hour(s)).  Procedures and diagnostic studies:  No results found.  Medications:   . FLUoxetine  40 mg Oral Daily  . fluticasone  2 spray Each Nare BID  . folic acid  1 mg Oral Daily  . furosemide  40 mg Intravenous Q12H  . gabapentin  300 mg Oral TID  . hydrOXYzine  50 mg Oral TID  . [START ON 09/30/2017] Influenza vac split quadrivalent PF  0.5 mL Intramuscular Tomorrow-1000  . insulin aspart  0-15 Units Subcutaneous TID WC  . insulin aspart  0-5 Units Subcutaneous QHS  . insulin glargine  20 Units Subcutaneous QHS  .  lactulose  20 g Oral BID  . pilocarpine  5 mg Oral TID AC & HS  . [START ON 09/30/2017] pneumococcal 23 valent vaccine  0.5 mL Intramuscular Tomorrow-1000  . prazosin  10 mg Oral QHS  . QUEtiapine  100 mg Oral QHS  . rifaximin  550 mg Oral TID  . sodium chloride flush  3 mL Intravenous Q12H  . spironolactone  25 mg Oral BID  . zinc sulfate  220 mg Oral Daily   Continuous Infusions: . sodium chloride       LOS: 2 days   Hillery Aldo  [facility] Pager [phone]. If unable to reach me by pager, please call my cell phone at [phone].  *Please refer to [email or web address], password [email or web address] to get updated schedule on who will round on this patient, as hospitalists switch teams weekly. If 7PM-7AM, please contact night-coverage at [email or web address], password [email or web address] for any overnight needs.  09/29/2017, 11:24 AM

## 2017-09-30 ENCOUNTER — Encounter (HOSPITAL_COMMUNITY): Payer: Self-pay | Admitting: Family Medicine

## 2017-09-30 DIAGNOSIS — D696 Thrombocytopenia, unspecified: Secondary | ICD-10-CM

## 2017-09-30 DIAGNOSIS — Z6841 Body Mass Index (BMI) 40.0 and over, adult: Secondary | ICD-10-CM

## 2017-09-30 DIAGNOSIS — E1122 Type 2 diabetes mellitus with diabetic chronic kidney disease: Secondary | ICD-10-CM

## 2017-09-30 DIAGNOSIS — R601 Generalized edema: Secondary | ICD-10-CM

## 2017-09-30 DIAGNOSIS — N183 Chronic kidney disease, stage 3 (moderate): Secondary | ICD-10-CM

## 2017-09-30 DIAGNOSIS — K7460 Unspecified cirrhosis of liver: Secondary | ICD-10-CM

## 2017-09-30 DIAGNOSIS — I5033 Acute on chronic diastolic (congestive) heart failure: Secondary | ICD-10-CM

## 2017-09-30 DIAGNOSIS — I700 Atherosclerosis of aorta: Secondary | ICD-10-CM

## 2017-09-30 HISTORY — DX: Atherosclerosis of aorta: I70.0

## 2017-09-30 LAB — GLUCOSE, CAPILLARY
GLUCOSE-CAPILLARY: 155 mg/dL — AB (ref 65–99)
GLUCOSE-CAPILLARY: 208 mg/dL — AB (ref 65–99)
Glucose-Capillary: 140 mg/dL — ABNORMAL HIGH (ref 65–99)
Glucose-Capillary: 170 mg/dL — ABNORMAL HIGH (ref 65–99)

## 2017-09-30 LAB — BASIC METABOLIC PANEL
Anion gap: 11 (ref 5–15)
BUN: 16 mg/dL (ref 6–20)
CALCIUM: 7.8 mg/dL — AB (ref 8.9–10.3)
CO2: 30 mmol/L (ref 22–32)
CREATININE: 1.6 mg/dL — AB (ref 0.61–1.24)
Chloride: 95 mmol/L — ABNORMAL LOW (ref 101–111)
GFR calc Af Amer: 48 mL/min — ABNORMAL LOW (ref >60)
GFR, EST NON AFRICAN AMERICAN: 41 mL/min — AB (ref >60)
Glucose, Bld: 171 mg/dL — ABNORMAL HIGH (ref 65–99)
Potassium: 3.6 mmol/L (ref 3.5–5.1)
Sodium: 136 mmol/L (ref 135–145)

## 2017-09-30 MED ORDER — FUROSEMIDE 40 MG PO TABS
40.0000 mg | ORAL_TABLET | Freq: Two times a day (BID) | ORAL | Status: DC
  Administered 2017-10-01: 40 mg via ORAL
  Filled 2017-09-30: qty 1

## 2017-09-30 MED ORDER — METOPROLOL TARTRATE 25 MG PO TABS
25.0000 mg | ORAL_TABLET | Freq: Two times a day (BID) | ORAL | Status: DC
  Administered 2017-09-30 – 2017-10-01 (×2): 25 mg via ORAL
  Filled 2017-09-30 (×2): qty 1

## 2017-09-30 MED ORDER — CLOPIDOGREL BISULFATE 75 MG PO TABS
75.0000 mg | ORAL_TABLET | Freq: Every day | ORAL | Status: DC
  Administered 2017-09-30 – 2017-10-01 (×2): 75 mg via ORAL
  Filled 2017-09-30 (×2): qty 1

## 2017-09-30 MED ORDER — METOPROLOL TARTRATE 25 MG PO TABS: 25.0000 mg | ORAL_TABLET | Freq: Two times a day (BID) | ORAL | Status: DC

## 2017-09-30 NOTE — Progress Notes (Signed)
Pt refusing IV access at this time due to issues with his previous IV's and bruising on his arms. States he would like to take his 0330 dose of IV lasix by mouth instead. Notified Blount, NP.

## 2017-09-30 NOTE — Progress Notes (Signed)
PROGRESS NOTE  Logan DeutscherWilliam Cole MVH:[id number] DOB: 1946/03/02 DOA: 09/26/2017 PCP: Patient, No Pcp Per  [facility]  Brief Narrative: 72 year old man with PMH diabetes on insulin, thrombocytopenia, cirrhosis, chronic kidney disease stage III, morbid obesity presented with abdominal pain, distention, lower extremity edema, shortness of breath.  Admitted for cirrhosis, ascites, acute CHF.  Imaging revealed resolution of the ascites after Lasix.  No paracentesis performed.  Assessment/Plan Acute diastolic CHF.  LVEF 60-65%. - improved, down 3.3 L since admission, but still has significant LE edema.  - Continue furosemide, Aldactone. - BMP in AM  Cirrhosis with acute hepatic encephalopathy, pancytopenia, coagulopathy, hypoalbuminemia, splenomegaly.  [facility]  Hepatitis C negative. - mentation likely at baseline, no evident confusion.  - Continue lactulose, rifaximin, spironolactone   Anasarca, combination of liver disease, CHF, hypoalbuminemia - tx as above  Diabetes mellitus type 2, hemoglobin A1c 6.4. - Blood sugars stable.  - continue Lantus, SSI  AKI versus chronic kidney disease stage III  - renal function stable, suspect at baseline - BMP in AM  Morbid obesity Body mass index is 50.29 kg/m.  Aortic atherosclerosis    Improving. Possibly home 2/24 if continues to improve  DVT prophylaxis: enoxaparin  Code Status: full Family Communication: wife at bedside  Disposition Plan: home    Logan Sacksaniel Goodrich, MD  [facility] Direct contact: [phone] --Via amion app OR  --[email or web address]; password [email or web address]  7PM-7AM contact night coverage as above 09/30/2017, 1:02 PM  LOS: 3 days   Consultants:    Procedures:  Echo Study Conclusions  - Left ventricle: The cavity size was normal. Wall thickness was   increased in a pattern of mild LVH. Systolic function was normal.   The estimated ejection fraction was in the range of 60% to 65%.  Wall motion was normal; there were no regional wall motion   abnormalities. - Right ventricle: The cavity size was mildly dilated. Wall   thickness was normal. - Pulmonary arteries: Systolic pressure was mildly to moderately   increased. PA peak pressure: 39 mm Hg (S).  Antimicrobials:    Interval history/Subjective: Feels better, some improvement in LE edema. Bowels moving frequently.   Objective: Vitals:  Vitals:   09/29/17 2102 09/30/17 0601  BP: (!) 154/68 (!) 142/44  Pulse: 89 81  Resp: (!) 22 18  Temp: 98.2 F (36.8 C) 98.2 F (36.8 C)  SpO2: 98% 92%    Exam:  Constitutional:  . Appears calm and comfortable, sitting on side of bed Eyes:  . pupils and irises appear normal . Normal lids  ENMT:  . Slightly hard of hearing  Respiratory:  . CTA bilaterally, no w/r/r.  . Respiratory effort normal Cardiovascular:  . RRR, no m/r/g . 2+ BLE extremity edema   Abdomen:  . Obese, soft Psychiatric:  . Mental status o Mood, affect appropriate o Oriented to person, hospital, day, month, year  . judgement and insight appear intact   I have personally reviewed the following:   Labs:  2/22 potassium 3.6, BUN within normal limits.  Creatinine improved, 1.6.  2/22 WBC stable 2.5, hemoglobin stable 7.9, platelets trending down, 89  INR 1.5 2/20  Imaging studies:  CT abdomen and pelvis hepatic cirrhosis, splenomegaly, ascites  Scheduled Meds: . enoxaparin (LOVENOX) injection  80 mg Subcutaneous Q24H  . FLUoxetine  40 mg Oral Daily  . fluticasone  2 spray Each Nare BID  . folic acid  1 mg Oral Daily  . furosemide  40 mg Intravenous Q12H  .  gabapentin  100 mg Oral TID  . Influenza vac split quadrivalent PF  0.5 mL Intramuscular Tomorrow-1000  . insulin aspart  0-15 Units Subcutaneous TID WC  . insulin aspart  0-5 Units Subcutaneous QHS  . insulin glargine  20 Units Subcutaneous QHS  . lactulose  30 g Oral TID  . pilocarpine  5 mg Oral TID AC & HS  .  pneumococcal 23 valent vaccine  0.5 mL Intramuscular Tomorrow-1000  . prazosin  10 mg Oral QHS  . QUEtiapine  100 mg Oral QHS  . rifaximin  550 mg Oral TID  . sodium chloride flush  3 mL Intravenous Q12H  . spironolactone  25 mg Oral BID  . zinc sulfate  220 mg Oral Daily   Continuous Infusions: . sodium chloride      Principal Problem:   Acute on chronic diastolic CHF (congestive heart failure) (HCC) Active Problems:   Diabetes mellitus with renal complications (HCC)   Liver cirrhosis (HCC)   CKD (chronic kidney disease), stage III (HCC)   Thrombocytopenia (HCC)   Hypoalbuminemia   Morbid obesity with BMI of 50.0-59.9, adult (HCC)   Hypokalemia   AKI (acute kidney injury) (HCC)   LOS: 3 days

## 2017-10-01 LAB — BASIC METABOLIC PANEL
Anion gap: 8 (ref 5–15)
BUN: 13 mg/dL (ref 6–20)
CALCIUM: 7.8 mg/dL — AB (ref 8.9–10.3)
CHLORIDE: 97 mmol/L — AB (ref 101–111)
CO2: 31 mmol/L (ref 22–32)
CREATININE: 1.49 mg/dL — AB (ref 0.61–1.24)
GFR calc non Af Amer: 45 mL/min — ABNORMAL LOW (ref >60)
GFR, EST AFRICAN AMERICAN: 52 mL/min — AB (ref >60)
Glucose, Bld: 200 mg/dL — ABNORMAL HIGH (ref 65–99)
Potassium: 3.4 mmol/L — ABNORMAL LOW (ref 3.5–5.1)
SODIUM: 136 mmol/L (ref 135–145)

## 2017-10-01 LAB — GLUCOSE, CAPILLARY: Glucose-Capillary: 161 mg/dL — ABNORMAL HIGH (ref 65–99)

## 2017-10-01 MED ORDER — SPIRONOLACTONE 25 MG PO TABS: 25.0000 mg | ORAL_TABLET | Freq: Every day | ORAL | 0 refills | Status: DC

## 2017-10-01 MED ORDER — FUROSEMIDE 40 MG PO TABS: 40.0000 mg | ORAL_TABLET | Freq: Every day | ORAL | 0 refills | Status: DC

## 2017-10-01 MED ORDER — POTASSIUM CHLORIDE CRYS ER 20 MEQ PO TBCR
40.0000 meq | EXTENDED_RELEASE_TABLET | Freq: Once | ORAL | Status: AC
  Administered 2017-10-01: 40 meq via ORAL
  Filled 2017-10-01: qty 2

## 2017-10-01 NOTE — Discharge Summary (Addendum)
Physician Discharge Summary  Logan Cole ZOX:[id number] DOB: June 07, 1946 DOA: 09/26/2017  PCP: Sherwood Gambler, MD  [name]  Admit date: 09/26/2017 Discharge date: 10/01/2017  Recommendations for Outpatient Follow-up:   Anasarca, combination of liver disease, CHF, hypoalbuminemia  Possible diastolic CHF.  LVEF 60-65%.  See discussion below  Cirrhosis with acute hepatic encephalopathy, pancytopenia, coagulopathy, hypoalbuminemia, splenomegaly.  Followed at [facility].  Hepatitis C negative. -Lasix and Aldactone added  Probable hemorrhagic cyst in the upper right kidney, unchanged in size over the past 10 months but increased in density. Consider confirmation with renal protocol MRI on an elective outpatient basis  Normocytic anemia  Follow-up Information    Clinic, [facility] Schedule an appointment as soon as possible for a visit in 1 week(s).   Contact information: [address] [facility] [place] [address] [phone]            Discharge Diagnoses:  1. Acute diastolic CHF 2. Anasarca 3. Cirrhosis with acute hepatic encephalopathy, pancytopenia, coagulopathy, hypoalbuminemia, splenomegaly 4. Diabetes mellitus type 2, hemoglobin A1c 6.4. 5. Chronic kidney disease stage III  6. Morbid obesity 7. Aortic atherosclerosis   Discharge Condition: improved Disposition: home  Diet recommendation: heart healthy, diabetic diet  Filed Weights   09/29/17 0432 09/30/17 0601 10/01/17 0442  Weight: (!) 167.6 kg (369 lb 7.9 oz) (!) 168.2 kg (370 lb 13 oz) (!) 162.1 kg (357 lb 6.4 oz)    History of present illness:  72 year old man with PMH diabetes on insulin, thrombocytopenia, cirrhosis, chronic kidney disease stage III, morbid obesity presented with abdominal pain, distention, lower extremity edema, shortness of breath.  Admitted for cirrhosis, ascites, acute CHF.  Imaging revealed resolution of the ascites after Lasix.  No paracentesis  performed.  Hospital Course:  Patient was treated with aggressive IV diuresis with gradual clinical improvement.  Mild confusion was noted and he responded well to lactulose.  At baseline he has chronic diarrhea and normally does not take lactulose, just Xifaxan.  Not currently on diuretics, will discharge home on spironolactone and Lasix.  I discussed close outpatient follow-up recommendation with him.  Anasarca, combination of liver disease, CHF, hypoalbuminemia - tx as below  Acute diastolic CHF.  LVEF 60-65%. - .  Much improved.  This is a questionable diagnosis at this point.  No diastolic dysfunction was noted on echocardiogram.  His edema may be secondary to cirrhosis only.   -Given this, we will discharge him diuretics but not on beta-blocker or calcium channel blocker.  Defer further evaluation to primary care physician.  Cirrhosis with acute hepatic encephalopathy, pancytopenia, coagulopathy, hypoalbuminemia, splenomegaly.  Followed at [facility].  Hepatitis C negative. - mentation appears to be at baseline, no evident confusion.  - Continue rifaximin, add spironolactone  on discharge  Diabetes mellitus type 2, hemoglobin A1c 6.4. - Blood sugars stable.  - continue Lantus   AKI versus chronic kidney disease stage III  -Suspect chronic kidney disease stage III.  Creatinine improving on discharge.  Morbid obesity Body mass index is 50.29 kg/m.  Aortic atherosclerosis    Procedures:  Echo Study Conclusions  - Left ventricle: The cavity size was normal. Wall thickness was increased in a pattern of mild LVH. Systolic function was normal. The estimated ejection fraction was in the range of 60% to 65%. Wall motion was normal; there were no regional wall motion abnormalities. - Right ventricle: The cavity size was mildly dilated. Wall thickness was normal. - Pulmonary arteries: Systolic pressure was mildly to moderately increased. PA peak  pressure: 39 mm Hg  (S).  Today's assessment: S: feels better. Breathing better than usual baseline. O: Vitals:  Vitals:   09/30/17 2122 10/01/17 0459  BP: (!) 169/54 (!) 139/47  Pulse: 77 64  Resp: 18 16  Temp: 99.3 F (37.4 C) 98.4 F (36.9 C)  SpO2: 93% 95%    Constitutional:  . Appears calm and comfortable Respiratory:  . CTA bilaterally, no w/r/r.  . Respiratory effort normal, speaks in full sentences. Cardiovascular:  . RRR, no m/r/g . 2+ bilateral LE extremity edema   Psychiatric:  . judgement and insight appear normal . Mental status o Mood, affect appropriate o Orientation to person, location, date, month, year  Potassium 3.4, creatinine trending down, 1.49.  Discharge Instructions  Discharge Instructions    Diet - low sodium heart healthy   Complete by:  As directed    Discharge instructions   Complete by:  As directed    Call your physician or seek immediate medical attention for shortness of breath, swelling, weight gain or worsening of condition.   Increase activity slowly   Complete by:  As directed      Allergies as of 10/01/2017      Reactions   Morphine And Related Rash      Medication List    TAKE these medications   acetaminophen 500 MG tablet Commonly known as:  TYLENOL Take 500 mg by mouth every 6 (six) hours as needed for mild pain.   ammonium lactate 12 % lotion Commonly known as:  LAC-HYDRIN Apply 1 application topically daily as needed for dry skin.   capsaicin 0.025 % cream Commonly known as:  ZOSTRIX Apply 1 application topically 3 (three) times daily as needed (skin care).   chlorhexidine 0.12 % solution Commonly known as:  PERIDEX Use as directed 15 mLs in the mouth or throat 2 (two) times daily as needed (mouth care).   clopidogrel 75 MG tablet Commonly known as:  PLAVIX Take 75 mg by mouth daily.   ferrous sulfate 325 (65 FE) MG tablet Take 325 mg by mouth daily.   FLUoxetine 20 MG tablet Commonly known as:  PROZAC Take 40 mg by  mouth every morning.   folic acid 1 MG tablet Commonly known as:  FOLVITE Take 1 mg by mouth daily.   furosemide 40 MG tablet Commonly known as:  LASIX Take 1 tablet (40 mg total) by mouth daily.   gabapentin 300 MG capsule Commonly known as:  NEURONTIN Take 300 mg by mouth 3 (three) times daily.   hydrOXYzine 50 MG tablet Commonly known as:  ATARAX/VISTARIL Take 50 mg by mouth 3 (three) times daily.   insulin glargine 100 unit/mL Sopn Commonly known as:  LANTUS Inject 50 Units into the skin 2 (two) times daily.   ipratropium-albuterol 0.5-2.5 (3) MG/3ML Soln Commonly known as:  DUONEB Take 3 mLs by nebulization every 6 (six) hours as needed (SOB).   loperamide 2 MG capsule Commonly known as:  IMODIUM Take 2 mg by mouth 4 (four) times daily as needed for diarrhea or loose stools.   loratadine 10 MG tablet Commonly known as:  CLARITIN Take 10 mg by mouth daily as needed for allergies.   metoprolol tartrate 25 MG tablet Commonly known as:  LOPRESSOR Take 12.5 mg by mouth 2 (two) times daily.   NOVOLOG FLEXPEN 100 UNIT/ML FlexPen Generic drug:  insulin aspart Inject 1-50 Units into the skin 3 (three) times daily with meals.   oxyCODONE 5 MG immediate  release tablet Commonly known as:  Oxy IR/ROXICODONE Take 10 mg by mouth every 4 (four) hours as needed for severe pain.   pantoprazole 40 MG tablet Commonly known as:  PROTONIX Take 40 mg by mouth daily as needed (acid reflux).   pilocarpine 5 MG tablet Commonly known as:  SALAGEN Take 5 mg by mouth 4 (four) times daily.   pravastatin 20 MG tablet Commonly known as:  PRAVACHOL Take 20 mg by mouth at bedtime.   prazosin 5 MG capsule Commonly known as:  MINIPRESS Take 10 mg by mouth at bedtime.   QUEtiapine 100 MG tablet Commonly known as:  SEROQUEL Take 100 mg by mouth at bedtime.   rifaximin 550 MG Tabs tablet Commonly known as:  XIFAXAN Take 550 mg by mouth 3 (three) times daily.   sodium fluoride 1.1  (0.5 F) MG/ML Soln Commonly known as:  LURIDE Take 1 drop by mouth 2 (two) times daily as needed (mouth care).   spironolactone 25 MG tablet Commonly known as:  ALDACTONE Take 1 tablet (25 mg total) by mouth daily.   zinc sulfate 220 (50 Zn) MG capsule Take 220 mg by mouth daily.      Allergies  Allergen Reactions  . Morphine And Related Rash    The results of significant diagnostics from this hospitalization (including imaging, microbiology, ancillary and laboratory) are listed below for reference.    Significant Diagnostic Studies: Dg Chest 2 View  Result Date: 09/26/2017 CLINICAL DATA:  Cough and fever EXAM: CHEST  2 VIEW COMPARISON:  November 14, 2016 FINDINGS: There is no edema or consolidation. There is cardiomegaly with pulmonary venous hypertension. No adenopathy. There is aortic atherosclerosis. No evident bone lesions. IMPRESSION: Pulmonary vascular congestion without edema or consolidation. There is aortic atherosclerosis. There is mild elevation of the left hemidiaphragm. Aortic Atherosclerosis (ICD10-I70.0). Electronically Signed   By: Bretta Bang III M.D.   On: 09/26/2017 19:14   Ct Abdomen Pelvis W Contrast  Result Date: 09/27/2017 CLINICAL DATA:  Abdominal distension. Intermittent right upper quadrant abdominal pain for 1 year. EXAM: CT ABDOMEN AND PELVIS WITH CONTRAST TECHNIQUE: Multidetector CT imaging of the abdomen and pelvis was performed using the standard protocol following bolus administration of intravenous contrast. CONTRAST:  ISOVUE-300 IOPAMIDOL (ISOVUE-300) INJECTION 61% COMPARISON:  CT 11/14/2016 FINDINGS: Lower chest: The heart is enlarged. Small left pleural effusion and adjacent atelectasis. Hepatobiliary: Nodular hepatic contours consistent with cirrhosis. No focal hepatic lesion. Clips in the gallbladder fossa postcholecystectomy. No biliary dilatation. Pancreas: No ductal dilatation or inflammation. Spleen: Splenomegaly, spleen measures 17.4 x  7.4 x 16.8 cm. No focal abnormality. Adrenals/Urinary Tract: No adrenal nodule. No hydronephrosis or perinephric edema. Homogeneous renal enhancement. Absent excretion on delayed phase imaging. 3.6 cm lesion in the upper right kidney has increased density from prior exam, unchanged in size 2.3 cm simple cyst in the lower right kidney. Urinary bladder is physiologically distended without wall thickening. Stomach/Bowel: Stomach is decompressed. No bowel obstruction. Presence of intra-abdominal ascites and lack of enteric contrast limits detailed bowel evaluation. No evidence of bowel wall thickening. Appendix is surgically absent. Vascular/Lymphatic: Aortic atherosclerosis. Shotty periportal nodes. Prominent upper retroperitoneal nodes are likely reactive. Reproductive: Prostate is unremarkable. Other: Moderate ascites throughout the abdomen, pelvis, and mesentery. No free air. No evidence of abscess. Ascites tracks into a small umbilical hernia. There is body wall edema. Musculoskeletal: Degenerative change in the spine. There are no acute or suspicious osseous abnormalities. IMPRESSION: 1. Hepatic cirrhosis and splenomegaly. 2. Moderate abdominopelvic  ascites.  Diffuse body wall edema. 3. Probable hemorrhagic cyst in the upper right kidney, unchanged in size over the past 10 months but increased in density. Consider confirmation with renal protocol MRI on an elective outpatient basis. 4. Absent renal excretion on delayed phase imaging suggest underlying renal dysfunction. 5.  Aortic Atherosclerosis (ICD10-I70.0). Electronically Signed   By: Rubye Oaks M.D.   On: 09/27/2017 01:06   Ir Abdomen US Limited  Result Date: 09/27/2017 CLINICAL DATA:  Evaluate for ascites and possible paracentesis. EXAM: LIMITED ABDOMEN ULTRASOUND FOR ASCITES TECHNIQUE: Limited ultrasound survey for ascites was performed in all four abdominal quadrants. COMPARISON:  CT 09/27/2017 FINDINGS: No significant ascites is present at this  time. Trace fluid in the abdominal cavity. IMPRESSION: No significant ascites.  Paracentesis not performed. Electronically Signed   By: Richarda Overlie M.D.   On: 09/27/2017 12:03    Labs: Basic Metabolic Panel: Recent Labs  Lab 09/26/17 1820 09/28/17 0345 09/29/17 0758 09/30/17 0416 10/01/17 0309  NA 138 137 136 136 136  K 3.6 3.4* 3.2* 3.6 3.4*  CL 102 96* 98* 95* 97*  CO2 26 29 28 30 31   GLUCOSE 197* 173* 142* 171* 200*  BUN 13 15 17 16 13   CREATININE 1.54* 1.89* 1.83* 1.60* 1.49*  CALCIUM 7.9* 7.9* 7.8* 7.8* 7.8*   Liver Function Tests: Recent Labs  Lab 09/26/17 1820 09/28/17 0345 09/29/17 0758  AST 31 28 32  ALT 14* 14* 13*  ALKPHOS 63 61 52  BILITOT 0.9 0.9 0.8  PROT 6.5 6.0* 5.7*  ALBUMIN 2.3* 2.2* 2.1*    Recent Labs  Lab 09/27/17 0306  AMMONIA 45*   CBC: Recent Labs  Lab 09/26/17 1820 09/28/17 0345 09/29/17 0758  WBC 4.0 2.9* 2.5*  NEUTROABS  --   --  1.4*  HGB 9.3* 8.3* 7.9*  HCT 29.3* 25.8* 24.5*  MCV 89.6 90.8 91.1  PLT 120* 101* 89*    Recent Labs    09/26/17 1820  BNP 57.5    CBG: Recent Labs  Lab 09/30/17 0731 09/30/17 1220 09/30/17 1633 09/30/17 2119 10/01/17 0754  GLUCAP 140* 170* 155* 208* 161*    Principal Problem:   Acute on chronic diastolic CHF (congestive heart failure) (HCC) Active Problems:   Diabetes mellitus with renal complications (HCC)   Liver cirrhosis (HCC)   CKD (chronic kidney disease), stage III (HCC)   Thrombocytopenia (HCC)   Hypoalbuminemia   Morbid obesity with BMI of 50.0-59.9, adult (HCC)   Anasarca   Aortic atherosclerosis (HCC)   Time coordinating discharge: 35 minutes  Signed:  Brendia Sacks, MD [facility] 10/01/2017, 12:32 PM

## 2017-10-01 NOTE — Progress Notes (Signed)
Pt given discharge instructions, prescriptions, and care notes. Pt verbalized understanding AEB no further questions or concerns at this time. No Iv access.   Telemetry discontinued and Centralized Telemetry was notified. Pt left the floor via wheelchair with staff and wifef in stable condition.

## 2017-12-20 ENCOUNTER — Emergency Department (HOSPITAL_COMMUNITY): Payer: Non-veteran care

## 2017-12-20 ENCOUNTER — Other Ambulatory Visit: Payer: Self-pay

## 2017-12-20 ENCOUNTER — Observation Stay (HOSPITAL_COMMUNITY)
Admission: EM | Admit: 2017-12-20 | Discharge: 2017-12-22 | Disposition: A | Discharge status: Home / Self Care | Payer: Non-veteran care | Attending: Family Medicine | Admitting: Family Medicine

## 2017-12-20 DIAGNOSIS — W19XXXD Unspecified fall, subsequent encounter: Secondary | ICD-10-CM

## 2017-12-20 DIAGNOSIS — E1142 Type 2 diabetes mellitus with diabetic polyneuropathy: Secondary | ICD-10-CM | POA: Insufficient documentation

## 2017-12-20 DIAGNOSIS — E1121 Type 2 diabetes mellitus with diabetic nephropathy: Secondary | ICD-10-CM | POA: Insufficient documentation

## 2017-12-20 DIAGNOSIS — Z79899 Other long term (current) drug therapy: Secondary | ICD-10-CM | POA: Diagnosis not present

## 2017-12-20 DIAGNOSIS — E1122 Type 2 diabetes mellitus with diabetic chronic kidney disease: Secondary | ICD-10-CM | POA: Diagnosis not present

## 2017-12-20 DIAGNOSIS — Z7902 Long term (current) use of antithrombotics/antiplatelets: Secondary | ICD-10-CM | POA: Insufficient documentation

## 2017-12-20 DIAGNOSIS — Z794 Long term (current) use of insulin: Secondary | ICD-10-CM | POA: Insufficient documentation

## 2017-12-20 DIAGNOSIS — R29898 Other symptoms and signs involving the musculoskeletal system: Secondary | ICD-10-CM | POA: Diagnosis present

## 2017-12-20 DIAGNOSIS — R188 Other ascites: Secondary | ICD-10-CM | POA: Insufficient documentation

## 2017-12-20 DIAGNOSIS — W19XXXA Unspecified fall, initial encounter: Secondary | ICD-10-CM

## 2017-12-20 DIAGNOSIS — N179 Acute kidney failure, unspecified: Secondary | ICD-10-CM | POA: Insufficient documentation

## 2017-12-20 DIAGNOSIS — Z9181 History of falling: Secondary | ICD-10-CM | POA: Diagnosis not present

## 2017-12-20 DIAGNOSIS — K7460 Unspecified cirrhosis of liver: Secondary | ICD-10-CM | POA: Insufficient documentation

## 2017-12-20 DIAGNOSIS — Z8673 Personal history of transient ischemic attack (TIA), and cerebral infarction without residual deficits: Secondary | ICD-10-CM | POA: Diagnosis not present

## 2017-12-20 DIAGNOSIS — F419 Anxiety disorder, unspecified: Secondary | ICD-10-CM | POA: Diagnosis not present

## 2017-12-20 DIAGNOSIS — Z6841 Body Mass Index (BMI) 40.0 and over, adult: Secondary | ICD-10-CM | POA: Diagnosis not present

## 2017-12-20 DIAGNOSIS — R531 Weakness: Principal | ICD-10-CM | POA: Insufficient documentation

## 2017-12-20 DIAGNOSIS — N183 Chronic kidney disease, stage 3 (moderate): Secondary | ICD-10-CM | POA: Insufficient documentation

## 2017-12-20 DIAGNOSIS — D61818 Other pancytopenia: Secondary | ICD-10-CM | POA: Insufficient documentation

## 2017-12-20 DIAGNOSIS — F329 Major depressive disorder, single episode, unspecified: Secondary | ICD-10-CM | POA: Diagnosis not present

## 2017-12-20 DIAGNOSIS — I130 Hypertensive heart and chronic kidney disease with heart failure and stage 1 through stage 4 chronic kidney disease, or unspecified chronic kidney disease: Secondary | ICD-10-CM | POA: Insufficient documentation

## 2017-12-20 DIAGNOSIS — D631 Anemia in chronic kidney disease: Secondary | ICD-10-CM | POA: Insufficient documentation

## 2017-12-20 DIAGNOSIS — N289 Disorder of kidney and ureter, unspecified: Secondary | ICD-10-CM

## 2017-12-20 DIAGNOSIS — R55 Syncope and collapse: Secondary | ICD-10-CM | POA: Diagnosis present

## 2017-12-20 LAB — URINALYSIS, ROUTINE W REFLEX MICROSCOPIC
BILIRUBIN URINE: NEGATIVE
Glucose, UA: 150 mg/dL — AB
HGB URINE DIPSTICK: NEGATIVE
Ketones, ur: NEGATIVE mg/dL
Leukocytes, UA: NEGATIVE
Nitrite: NEGATIVE
Protein, ur: NEGATIVE mg/dL
Specific Gravity, Urine: 1.013 (ref 1.005–1.030)
pH: 5 (ref 5.0–8.0)

## 2017-12-20 LAB — CBC WITH DIFFERENTIAL/PLATELET
Abs Immature Granulocytes: 0 10*3/uL (ref 0.0–0.1)
BASOS ABS: 0 10*3/uL (ref 0.0–0.1)
BASOS PCT: 1 %
EOS ABS: 0.1 10*3/uL (ref 0.0–0.7)
EOS PCT: 3 %
HCT: 24.7 % — ABNORMAL LOW (ref 39.0–52.0)
HEMOGLOBIN: 7.7 g/dL — AB (ref 13.0–17.0)
Immature Granulocytes: 0 %
Lymphocytes Relative: 17 %
Lymphs Abs: 0.5 10*3/uL — ABNORMAL LOW (ref 0.7–4.0)
MCH: 28 pg (ref 26.0–34.0)
MCHC: 31.2 g/dL (ref 30.0–36.0)
MCV: 89.8 fL (ref 78.0–100.0)
MONO ABS: 0.3 10*3/uL (ref 0.1–1.0)
Monocytes Relative: 9 %
Neutro Abs: 2.3 10*3/uL (ref 1.7–7.7)
Neutrophils Relative %: 70 %
Platelets: 99 10*3/uL — ABNORMAL LOW (ref 150–400)
RBC: 2.75 MIL/uL — ABNORMAL LOW (ref 4.22–5.81)
RDW: 16.5 % — AB (ref 11.5–15.5)
WBC: 3.3 10*3/uL — ABNORMAL LOW (ref 4.0–10.5)

## 2017-12-20 LAB — COMPREHENSIVE METABOLIC PANEL
ALBUMIN: 2.3 g/dL — AB (ref 3.5–5.0)
ALK PHOS: 65 U/L (ref 38–126)
ALT: 15 U/L — ABNORMAL LOW (ref 17–63)
AST: 32 U/L (ref 15–41)
Anion gap: 9 (ref 5–15)
BILIRUBIN TOTAL: 0.7 mg/dL (ref 0.3–1.2)
BUN: 35 mg/dL — AB (ref 6–20)
CALCIUM: 8.3 mg/dL — AB (ref 8.9–10.3)
CO2: 26 mmol/L (ref 22–32)
CREATININE: 3.1 mg/dL — AB (ref 0.61–1.24)
Chloride: 105 mmol/L (ref 101–111)
GFR, EST AFRICAN AMERICAN: 22 mL/min — AB (ref >60)
GFR, EST NON AFRICAN AMERICAN: 19 mL/min — AB (ref >60)
Glucose, Bld: 144 mg/dL — ABNORMAL HIGH (ref 65–99)
Potassium: 4.2 mmol/L (ref 3.5–5.1)
Sodium: 140 mmol/L (ref 135–145)
TOTAL PROTEIN: 6.9 g/dL (ref 6.5–8.1)

## 2017-12-20 LAB — APTT: aPTT: 36 seconds (ref 24–36)

## 2017-12-20 LAB — PROTIME-INR
INR: 1.67
PROTHROMBIN TIME: 19.6 s — AB (ref 11.4–15.2)

## 2017-12-20 LAB — CBG MONITORING, ED: Glucose-Capillary: 161 mg/dL — ABNORMAL HIGH (ref 65–99)

## 2017-12-20 LAB — TROPONIN I: Troponin I: <0.03 ng/mL (ref <0.03)

## 2017-12-20 MED ORDER — IPRATROPIUM-ALBUTEROL 0.5-2.5 (3) MG/3ML IN SOLN: 3.0000 mL | Freq: Four times a day (QID) | RESPIRATORY_TRACT | Status: DC | PRN

## 2017-12-20 MED ORDER — INSULIN GLARGINE 100 UNIT/ML ~~LOC~~ SOLN
25.0000 [IU] | Freq: Two times a day (BID) | SUBCUTANEOUS | Status: DC
  Administered 2017-12-21 – 2017-12-22 (×4): 25 [IU] via SUBCUTANEOUS
  Filled 2017-12-20 (×7): qty 0.25

## 2017-12-20 MED ORDER — ZINC OXIDE 20 % EX OINT: 1.0000 "application " | TOPICAL_OINTMENT | Freq: Two times a day (BID) | CUTANEOUS | Status: DC | PRN

## 2017-12-20 MED ORDER — RIFAXIMIN 550 MG PO TABS
550.0000 mg | ORAL_TABLET | Freq: Three times a day (TID) | ORAL | Status: DC
  Administered 2017-12-21 – 2017-12-22 (×5): 550 mg via ORAL
  Filled 2017-12-20 (×7): qty 1

## 2017-12-20 MED ORDER — FERROUS SULFATE 325 (65 FE) MG PO TABS
325.0000 mg | ORAL_TABLET | Freq: Every day | ORAL | Status: DC
  Administered 2017-12-21 – 2017-12-22 (×2): 325 mg via ORAL
  Filled 2017-12-20 (×3): qty 1

## 2017-12-20 MED ORDER — ACETAMINOPHEN 325 MG PO TABS: 650.0000 mg | ORAL_TABLET | Freq: Four times a day (QID) | ORAL | Status: DC | PRN

## 2017-12-20 MED ORDER — HYDROCERIN EX CREA
1.0000 "application " | TOPICAL_CREAM | Freq: Every day | CUTANEOUS | Status: DC
  Administered 2017-12-21 – 2017-12-22 (×2): 1 via TOPICAL
  Filled 2017-12-20: qty 113

## 2017-12-20 MED ORDER — ACETAMINOPHEN 650 MG RE SUPP: 650.0000 mg | Freq: Four times a day (QID) | RECTAL | Status: DC | PRN

## 2017-12-20 MED ORDER — SODIUM CHLORIDE 0.9 % IV BOLUS
500.0000 mL | Freq: Once | INTRAVENOUS | Status: AC
  Administered 2017-12-20: 500 mL via INTRAVENOUS

## 2017-12-20 MED ORDER — FOLIC ACID 1 MG PO TABS
1.0000 mg | ORAL_TABLET | Freq: Every day | ORAL | Status: DC
  Administered 2017-12-21 – 2017-12-22 (×2): 1 mg via ORAL
  Filled 2017-12-20 (×2): qty 1

## 2017-12-20 MED ORDER — METOPROLOL TARTRATE 12.5 MG HALF TABLET
12.5000 mg | ORAL_TABLET | Freq: Two times a day (BID) | ORAL | Status: DC
  Administered 2017-12-21 – 2017-12-22 (×4): 12.5 mg via ORAL
  Filled 2017-12-20 (×4): qty 1

## 2017-12-20 MED ORDER — CLOPIDOGREL BISULFATE 75 MG PO TABS
75.0000 mg | ORAL_TABLET | Freq: Every day | ORAL | Status: DC
  Administered 2017-12-21 – 2017-12-22 (×2): 75 mg via ORAL
  Filled 2017-12-20 (×2): qty 1

## 2017-12-20 MED ORDER — OXYCODONE HCL 5 MG PO TABS
5.0000 mg | ORAL_TABLET | ORAL | Status: DC | PRN
  Administered 2017-12-21 (×2): 5 mg via ORAL
  Filled 2017-12-20 (×2): qty 1

## 2017-12-20 MED ORDER — AMMONIUM LACTATE 12 % EX LOTN: 1.0000 "application " | TOPICAL_LOTION | CUTANEOUS | Status: DC | PRN

## 2017-12-20 MED ORDER — POLYETHYLENE GLYCOL 3350 17 G PO PACK: 17.0000 g | PACK | Freq: Every day | ORAL | Status: DC | PRN

## 2017-12-20 MED ORDER — INSULIN ASPART 100 UNIT/ML ~~LOC~~ SOLN
0.0000 [IU] | Freq: Three times a day (TID) | SUBCUTANEOUS | Status: DC
  Administered 2017-12-21: 3 [IU] via SUBCUTANEOUS
  Administered 2017-12-21 – 2017-12-22 (×2): 2 [IU] via SUBCUTANEOUS
  Filled 2017-12-20: qty 1

## 2017-12-20 MED ORDER — SODIUM CHLORIDE 0.9% FLUSH
3.0000 mL | Freq: Two times a day (BID) | INTRAVENOUS | Status: DC
  Administered 2017-12-21 – 2017-12-22 (×3): 3 mL via INTRAVENOUS

## 2017-12-20 NOTE — ED Provider Notes (Signed)
[facility] EMERGENCY DEPARTMENT Provider Note   CSN: [id number] Arrival date & time: 12/20/17  1031     History   Chief Complaint Chief Complaint  Patient presents with  . Weakness    HPI Logan Cole is a 72 y.o. male.  HPI Patient presents with generalized weakness.  Has had for the last few days.  Has had more falls than baseline.  A fall every couple months is not unusual for him but was reportedly had 3 in the last 4 days.  Unknown if he hit his head.  Has some bruising in his left flank and left chest.  Has been feeling generally weak.  Has had slower answering questions.  Does have history of cirrhosis.  No abdominal pain.  Occasional chest pain. Past Medical History:  Diagnosis Date  . Aortic atherosclerosis (HCC) 09/30/2017  . CHF (congestive heart failure) (HCC)   . CKD (chronic kidney disease)   . Diabetes mellitus without complication (HCC)   . Liver dysfunction   . Morbid obesity with BMI of 50.0-59.9, adult (HCC) 09/29/2017  . Renal cyst, right   . Renal disorder   . Retinal vein occlusion of left eye   . Thrombocytopenia [facility])     Patient Active Problem List   Diagnosis Date Noted  . Anasarca 09/30/2017  . Aortic atherosclerosis (HCC) 09/30/2017  . Acute on chronic diastolic CHF (congestive heart failure) (HCC) 09/29/2017  . Morbid obesity with BMI of 50.0-59.9, adult (HCC) 09/29/2017  . Liver cirrhosis (HCC) 09/27/2017  . Hypoalbuminemia 09/27/2017  . Liver dysfunction   . Diabetes mellitus with renal complications (HCC)   . CKD (chronic kidney disease), stage III (HCC)   . Thrombocytopenia (HCC)   . Renal cyst, right     Past Surgical History:  Procedure Laterality Date  . APPENDECTOMY    . CHOLECYSTECTOMY          Home Medications    Prior to Admission medications   Medication Sig Start Date End Date Taking? Authorizing Provider  ammonium lactate (LAC-HYDRIN) 12 % lotion Apply 1 application topically daily as needed  for dry skin.   Yes [provider]  ARTIFICIAL SALIVA MT Use as directed 4 sprays in the mouth or throat daily as needed.   Yes [provider]  clopidogrel (PLAVIX) 75 MG tablet Take 75 mg by mouth daily.   Yes [provider]  clotrimazole (LOTRIMIN) 1 % cream Apply 1 application topically daily. To feet   Yes [provider]  ferrous sulfate 325 (65 FE) MG tablet Take 325 mg by mouth daily.   Yes [provider]  folic acid (FOLVITE) 1 MG tablet Take 1 mg by mouth daily.   Yes [provider]  furosemide (LASIX) 40 MG tablet Take 1 tablet (40 mg total) by mouth daily. 10/01/17  Yes Standley Brooking, MD  gabapentin (NEURONTIN) 300 MG capsule Take 300 mg by mouth 2 (two) times daily.    Yes [provider]  hydrOXYzine (ATARAX/VISTARIL) 50 MG tablet Take 50 mg by mouth 3 (three) times daily as needed for anxiety.    Yes [provider]  insulin aspart (NOVOLOG FLEXPEN) 100 UNIT/ML FlexPen Inject 25 Units into the skin 3 (three) times daily with meals.    Yes [provider]  Insulin Glargine (LANTUS SOLOSTAR) 100 UNIT/ML Solostar Pen Inject 50 Units into the skin 2 (two) times daily.   Yes [provider]  ipratropium-albuterol (DUONEB) 0.5-2.5 (3) MG/3ML SOLN  Take 3 mLs by nebulization every 6 (six) hours as needed (SOB).   Yes [provider]  lisinopril (PRINIVIL,ZESTRIL) 20 MG tablet Take 20 mg by mouth daily.   Yes [provider]  loperamide (IMODIUM) 2 MG capsule Take 2 mg by mouth 4 (four) times daily as needed for diarrhea or loose stools.   Yes [provider]  loratadine (CLARITIN) 10 MG tablet Take 10 mg by mouth daily.    Yes [provider]  metoprolol tartrate (LOPRESSOR) 25 MG tablet Take 12.5 mg by mouth 2 (two) times daily.   Yes [provider]  NON FORMULARY Apply 1 application topically daily as needed ("Lanolin/Mineral Oil" unscented - as  directed).   Yes [provider]  oxyCODONE (OXY IR/ROXICODONE) 5 MG immediate release tablet Take 10 mg by mouth every 4 (four) hours as needed for severe pain.   Yes [provider]  pantoprazole (PROTONIX) 40 MG tablet Take 40 mg by mouth daily.    Yes [provider]  PARoxetine (PAXIL) 40 MG tablet Take 40 mg by mouth every morning.   Yes [provider]  prazosin (MINIPRESS) 5 MG capsule Take 10 mg by mouth at bedtime.   Yes [provider]  rifaximin (XIFAXAN) 550 MG TABS tablet Take 550 mg by mouth 3 (three) times daily.   Yes [provider]  Skin Protectants, Misc. (EUCERIN) cream Apply 1 application topically daily. To arms and legs   Yes [provider]  spironolactone (ALDACTONE) 25 MG tablet Take 1 tablet (25 mg total) by mouth daily. 10/01/17  Yes Standley Brooking, MD  zinc oxide 20 % ointment Apply 1 application topically 2 (two) times daily as needed for irritation.   Yes [provider]  zinc sulfate 220 (50 Zn) MG capsule Take 220 mg by mouth daily.   Yes [provider]    Family History Family History  Problem Relation Age of Onset  . Hypertension Mother   . Diabetes Father     Social History Social History   Tobacco Use  . Smoking status: Never Smoker  . Smokeless tobacco: Never Used  Substance Use Topics  . Alcohol use: No    Frequency: Never  . Drug use: No     Allergies   Lescol [fluvastatin sodium]; Other; Vardenafil; and Morphine and related   Review of Systems Review of Systems  Constitutional: Positive for appetite change.  HENT: Negative for congestion.   Respiratory: Negative for shortness of breath.   Cardiovascular: Positive for chest pain and leg swelling.  Gastrointestinal: Positive for vomiting. Negative for abdominal pain.  Endocrine: Negative for polyuria.  Genitourinary: Negative for dysuria.  Musculoskeletal: Negative for back pain.  Skin: Negative  for rash.  Neurological: Positive for light-headedness.  Hematological: Bruises/bleeds easily.     Physical Exam Updated Vital Signs BP (!) 110/93   Pulse 67   Temp 98 F (36.7 C) (Oral)   Resp 16   Ht 6' (1.829 m)   Wt (!) 154.2 kg (340 lb)   SpO2 97%   BMI 46.11 kg/m   Physical Exam  Constitutional: He appears well-developed.  HENT:  Head: Normocephalic and atraumatic.  Eyes: EOM are normal.  Neck: Neck supple.  Pulmonary/Chest: He has no wheezes. He has no rales.  Bruising left posterior lateral chest wall.  Abdominal: He exhibits distension.  Musculoskeletal:  Pitting edema bilateral lower extremities.Bruising left lateral lower flank.  Neurological: He is alert.  Patient is alert  and able answer questions but slow to answer which is worse than his baseline.  Appears moving all extremities.  Good grip strength bilaterally.  Skin: Skin is warm. Capillary refill takes less than 2 seconds.     ED Treatments / Results  Labs (all labs ordered are listed, but only abnormal results are displayed) Labs Reviewed  COMPREHENSIVE METABOLIC PANEL - Abnormal; Notable for the following components:      Result Value   Glucose, Bld 144 (*)    BUN 35 (*)    Creatinine, Ser 3.10 (*)    Calcium 8.3 (*)    Albumin 2.3 (*)    ALT 15 (*)    GFR calc non Af Amer 19 (*)    GFR calc Af Amer 22 (*)    All other components within normal limits  CBC WITH DIFFERENTIAL/PLATELET - Abnormal; Notable for the following components:   WBC 3.3 (*)    RBC 2.75 (*)    Hemoglobin 7.7 (*)    HCT 24.7 (*)    RDW 16.5 (*)    Platelets 99 (*)    Lymphs Abs 0.5 (*)    All other components within normal limits  URINALYSIS, ROUTINE W REFLEX MICROSCOPIC - Abnormal; Notable for the following components:   Glucose, UA 150 (*)    All other components within normal limits  CBG MONITORING, ED - Abnormal; Notable for the following components:   Glucose-Capillary 161 (*)    All other components within  normal limits  TROPONIN I    EKG EKG Interpretation  Date/Time:  Wednesday Dec 20 2017 12:21:53 EDT Ventricular Rate:  65 PR Interval:  182 QRS Duration: 98 QT Interval:  484 QTC Calculation: 503 R Axis:   -28 Text Interpretation:  Normal sinus rhythm Septal infarct , age undetermined Prolonged QT Abnormal ECG Confirmed by Benjiman Core [phone]) on 12/20/2017 12:48:28 PM   Radiology Ct Head Wo Contrast  Result Date: 12/20/2017 CLINICAL DATA:  Minor head trauma, legs giving way, weaker than normal for 3 weeks, history CHF, chronic kidney disease, diabetes mellitus, cirrhosis EXAM: CT HEAD WITHOUT CONTRAST TECHNIQUE: Contiguous axial images were obtained from the base of the skull through the vertex without intravenous contrast. Sagittal and coronal MPR images reconstructed from axial data set. COMPARISON:  11/14/2016 FINDINGS: Brain: Generalized atrophy. Normal ventricular morphology. No midline shift or mass effect. Minimal small vessel chronic ischemic changes of deep cerebral white matter. No intracranial hemorrhage, mass lesion, or evidence of acute infarction. No extra-axial fluid collections. Vascular: No hyperdense vessels. Skull: Demineralized but intact Sinuses/Orbits: Paranasal sinuses and mastoid air cells clear Other: N/A IMPRESSION: Atrophy with minimal small vessel chronic ischemic changes of deep cerebral white matter. No acute intracranial abnormalities. Electronically Signed   By: Ulyses Southward M.D.   On: 12/20/2017 15:07   Dg Chest Portable 1 View  Result Date: 12/20/2017 CLINICAL DATA:  Generalized weakness over the last 3 weeks, history of CHF and diabetes EXAM: PORTABLE CHEST 1 VIEW COMPARISON:  Chest x-ray of 09/26/2017 FINDINGS: There is little change in moderate cardiomegaly. No definite pneumonia or pleural effusion is seen although a small left effusion would be difficult to exclude. Very minimal pulmonary vascular congestion may be present. No bony abnormality is  seen. IMPRESSION: 1. Stable cardiomegaly. 2. Question mild pulmonary vascular congestion. Electronically Signed   By: Dwyane Dee M.D.   On: 12/20/2017 11:27    Procedures Procedures (including critical care time)  Medications Ordered in ED Medications  sodium chloride  0.9 % bolus 500 mL (0 mLs Intravenous Stopped 12/20/17 1221)     Initial Impression / Assessment and Plan / ED Course  I have reviewed the triage vital signs and the nursing notes.  Pertinent labs & imaging results that were available during my care of the patient were reviewed by me and considered in my medical decision making (see chart for details).     Patient with generalized weakness.  Weaker than normal.  Has history of cirrhosis.  Now bumped his creatinine.  Initial hypotension but no clear infection.  Hemoglobin near baseline.  Will admit to [facility] for unassigned  Final Clinical Impressions(s) / ED Diagnoses   Final diagnoses:  Renal insufficiency  Cirrhosis of liver with ascites, unspecified hepatic cirrhosis type [facility])  Fall, initial encounter    ED Discharge Orders    None       Benjiman Core, MD 12/20/17 1530

## 2017-12-20 NOTE — ED Notes (Signed)
Dinner tray ordered; carb modified diet 

## 2017-12-20 NOTE — H&P (Addendum)
Family Medicine Teaching [facility] Admission History and Physical Service Pager: [phone]  Patient name: Logan Cole Medical record number: [id number] Date of birth: Jun 09, 1946 Age: 72 y.o. Gender: male  Primary Care Provider: Sherwood Gambler, MD Consultants: None Code Status: Full (Confirmed on admission)  Chief Complaint: Weakness and Fall  Assessment and Plan: Logan Cole is a 72 y.o. male presenting with 3 weeks of progressive weakness and multiple falls. PMH is significant for liver cirrhosis, CKD 3, HFpEF, morbid obesity, history of prior CVA, diabetes mellitus with peripheral neuropathy, depression with anxiety, hemorrhagic right renal cyst   Progressive weakness  Falls Acute on chronic. Progressive weakness falls over the past 3 weeks.  No prodrome symptoms associated.  Most of his falls have been observed by family members who endorse that they occur when patient is getting up from seated position. Patient does endorse brief loss of vision versus consciousness when these occur, but resolves quickly.  No history of any infectious type symptoms.  History not consistent with seizure.  No chest pain, shortness of breath associate with events.  He has no new neuro deficits on admission, although he does have chronic facial droop from prior stroke.  He did present initially mildly hypotensive at 90/50, however this improved to 110/70 with small fluid bolus.  Patient did endorse feeling slightly dizzy headed earlier today, but this is mostly improved. Work-up in ED mostly unremarkable including normal electrolytes, no pneumonia on chest x-ray, negative UA.  EKG showed NRS.  Troponin was negative.  CT head showed chronic vascular disease, no acute findings.  CT abdomen cirrhosis with splenic megaly with ascites, significantly improved over prior examination in 09/2017.  Patient is currently on multiple anticholinergic medications including hydroxyzine, Claritin, Paxil.  Also on  prazosin which can cause worsening hypertension due to alpha blockade.  Also on sedating medications oxycodone 10 mg 4 times daily, gabapentin.  Given patient is no cardiac symptoms and cardiac work-up thus far is negative, I do not suspect ACS etiology.  He does not appear to be significantly volume overloaded, and I do not suspect CHF at this time. He does not have waxing/waning mental status so I do not think that he appears encephalopathic. Patient appears to have some baseline dementia, and will need work up to rule out reversible causes. it is most likely a multi-factorial process from medication interaction/side effects.   -Admit to telemetry, attending Dr. Pollie Meyer -hold home anticholinergic medications including Paxil, hydralazine, Claritin -Hold home prazosin -Hold home sedating medications oxycodone, gabapentin -PT/OT -Repeat CMP, CBC -Ammonia  -Stratification labs lipid, TSH, A1c -Orthostatic vital signs -Mag and phosphorus -Trend troponins, repeat a.m. EKG -Tylenol PRN for pain -Vital signs per floor -HIV, RPR, B12, folate -Up with assistance -Continuous pulse ox -Cardiac monitoring -Vital signs per floor  Acute on CKD3  Creatinine on admission 3.1, baseline 1.5-1.8.  Most likely prerenal in setting of decreased p.o. Not oligouric.  -Trend creatinine -Hold home nephro toxic agents Lasix, spironolactone  History of  hemorrhagic cyst  Abdominal CT shows stable 4 cm rounded density in the upper right kidney and stable 2.4 cm cyst on the lower right kidney.  Findings are without obstruction.  Appearance is consistent with scan on zero 2/201 9.  Recommend monitoring with MRI in outpatient setting   Liver cirrhosis with pancytopenia LFTs stable to baseline.  Albumin 2.3.  Patient has pancytopenia with WBC 3, hemoglobin 7.7, platelets 99.  Currently takes rifampin, does not take lactulose.  Prior hep C negative  in 09/2017.  Most likely fatty liver disease causing cirrhosis, however  unsure if full cause. -PT INR -Continue home rifampin -Repeat CMP -Trend CBC -Recommend [facility] -Holding Lasix, spironolactone in setting of AKI  Diabetes mellitus with peripheral nephropathy Patient takes 50 units of Lantus twice daily, 25 units of aspart 3 times daily with meals.  Last A1c was 6 in 09/2017. -25 units Lantus twice daily -Sliding scale sensitive with every 4 CBG, will likely need more -Repeat A1c  History of CVA Baseline facial droop, no residual neuro findings.  Head CT negative for stroke. -Continue home Plavix  ? HFpEF Questionable diagnosis of heart failure diastolic during last admission.  Patient had EF of 60-65% in 09/2017 without valvular or wall motion normalities.  Patient is not on ACE inhibitor.  Does not appear overtly fluid overloaded on admission. Arrhythmia could contribute to falls though patient NSR on admission. -Continue home metoprolol -Strict I's and O's -Hold Lasix, spironolactone in setting of AKI -Daily weights  FEN/GI: Carb modified diet Prophylaxis: SCD, holding any coagulation setting of thrombocytopenia  Disposition: Observation  History of Present Illness:  Logan Cole is a 72 y.o. male presenting with 3 weeks of progressive weakness and multiple falls.  Patient has had 6 falls over the past 3 weeks.  3 falls over the past week.  Patient denies any symptoms that occur before the fall such as double vision or dizziness.  He does say that he loses vision the incident of the fall and "loses consciousness".  This is very brief and resolves quickly over amount of seconds.  He denies bowel or bladder incontinence.  This also been witnessed by family and they deny any type of shaking or postictal confusion.  Patient is usually standing from a seated position when these occur. He denies any type of chest pain, shortness of breath, abdominal pain or distention, dysuria, congestion fevers or chills, or other  infectious symptoms.  In the ED, patient presented with mild hypertension 90/40.  This improved to normotensive 120/70 after small fluid bolus.  Other vital signs are stable, patient satting well on room air.  Review Of Systems: Per HPI with the following additions:   Review of Systems  Constitutional: Negative for chills, diaphoresis, fever, malaise/fatigue and weight loss.  HENT: Negative for congestion, sinus pain and sore throat.   Eyes: Negative for blurred vision and double vision.  Respiratory: Negative for cough, shortness of breath and wheezing.   Cardiovascular: Negative for chest pain, palpitations, orthopnea and leg swelling.  Gastrointestinal: Negative for abdominal pain, blood in stool, constipation, diarrhea, nausea and vomiting.  Genitourinary: Negative for dysuria, frequency and urgency.  Musculoskeletal: Positive for falls.  Neurological: Positive for dizziness, loss of consciousness and weakness. Negative for sensory change, speech change, seizures and headaches.  Psychiatric/Behavioral: Negative for depression, substance abuse and suicidal ideas.    Patient Active Problem List   Diagnosis Date Noted  . Anasarca 09/30/2017  . Aortic atherosclerosis (HCC) 09/30/2017  . Acute on chronic diastolic CHF (congestive heart failure) (HCC) 09/29/2017  . Morbid obesity with BMI of 50.0-59.9, adult (HCC) 09/29/2017  . Liver cirrhosis (HCC) 09/27/2017  . Hypoalbuminemia 09/27/2017  . Liver dysfunction   . Diabetes mellitus with renal complications (HCC)   . CKD (chronic kidney disease), stage III (HCC)   . Thrombocytopenia (HCC)   . Renal cyst, right     Past Medical History: Past Medical History:  Diagnosis Date  . Aortic atherosclerosis (HCC) 09/30/2017  .  CHF (congestive heart failure) (HCC)   . CKD (chronic kidney disease)   . Diabetes mellitus without complication (HCC)   . Liver dysfunction   . Morbid obesity with BMI of 50.0-59.9, adult (HCC) 09/29/2017  . Renal  cyst, right   . Renal disorder   . Retinal vein occlusion of left eye   . Thrombocytopenia (HCC)     Past Surgical History: Past Surgical History:  Procedure Laterality Date  . APPENDECTOMY    . CHOLECYSTECTOMY      Social History: Social History   Tobacco Use  . Smoking status: Never Smoker  . Smokeless tobacco: Never Used  Substance Use Topics  . Alcohol use: No    Frequency: Never  . Drug use: No   Additional social history: Disabled, lives at home, uses a walker to ambulate, wife is primary caregiver, denies smoking, drinking alcohol, doing any drugs Please also refer to relevant sections of EMR.  Family History: Family History  Problem Relation Age of Onset  . Hypertension Mother   . Diabetes Father    Allergies and Medications: Allergies  Allergen Reactions  . Lescol [Fluvastatin Sodium] Other (See Comments)    unspecified  . Other Other (See Comments)    Non-steroidal anti-inflammatory  . Vardenafil Other (See Comments)    unspecified  . Morphine And Related Rash   No current facility-administered medications on file prior to encounter.    Current Outpatient Medications on File Prior to Encounter  Medication Sig Dispense Refill  . ammonium lactate (LAC-HYDRIN) 12 % lotion Apply 1 application topically daily as needed for dry skin.    Marland Kitchen ARTIFICIAL SALIVA MT Use as directed 4 sprays in the mouth or throat daily as needed.    . clopidogrel (PLAVIX) 75 MG tablet Take 75 mg by mouth daily.    . clotrimazole (LOTRIMIN) 1 % cream Apply 1 application topically daily. To feet    . ferrous sulfate 325 (65 FE) MG tablet Take 325 mg by mouth daily.    . folic acid (FOLVITE) 1 MG tablet Take 1 mg by mouth daily.    . furosemide (LASIX) 40 MG tablet Take 1 tablet (40 mg total) by mouth daily. 30 tablet 0  . gabapentin (NEURONTIN) 300 MG capsule Take 300 mg by mouth 2 (two) times daily.     . hydrOXYzine (ATARAX/VISTARIL) 50 MG tablet Take 50 mg by mouth 3 (three)  times daily as needed for anxiety.     . insulin aspart (NOVOLOG FLEXPEN) 100 UNIT/ML FlexPen Inject 25 Units into the skin 3 (three) times daily with meals.     . Insulin Glargine (LANTUS SOLOSTAR) 100 UNIT/ML Solostar Pen Inject 50 Units into the skin 2 (two) times daily.    Marland Kitchen ipratropium-albuterol (DUONEB) 0.5-2.5 (3) MG/3ML SOLN Take 3 mLs by nebulization every 6 (six) hours as needed (SOB).    Marland Kitchen lisinopril (PRINIVIL,ZESTRIL) 20 MG tablet Take 20 mg by mouth daily.    Marland Kitchen loperamide (IMODIUM) 2 MG capsule Take 2 mg by mouth 4 (four) times daily as needed for diarrhea or loose stools.    Marland Kitchen loratadine (CLARITIN) 10 MG tablet Take 10 mg by mouth daily.     . metoprolol tartrate (LOPRESSOR) 25 MG tablet Take 12.5 mg by mouth 2 (two) times daily.    . NON FORMULARY Apply 1 application topically daily as needed ("Lanolin/Mineral Oil" unscented - as directed).    Marland Kitchen oxyCODONE (OXY IR/ROXICODONE) 5 MG immediate release tablet Take 10 mg by mouth  every 4 (four) hours as needed for severe pain.    . pantoprazole (PROTONIX) 40 MG tablet Take 40 mg by mouth daily.     Marland Kitchen PARoxetine (PAXIL) 40 MG tablet Take 40 mg by mouth every morning.    . prazosin (MINIPRESS) 5 MG capsule Take 10 mg by mouth at bedtime.    . rifaximin (XIFAXAN) 550 MG TABS tablet Take 550 mg by mouth 3 (three) times daily.    . Skin Protectants, Misc. (EUCERIN) cream Apply 1 application topically daily. To arms and legs    . spironolactone (ALDACTONE) 25 MG tablet Take 1 tablet (25 mg total) by mouth daily. 30 tablet 0  . zinc oxide 20 % ointment Apply 1 application topically 2 (two) times daily as needed for irritation.    Marland Kitchen zinc sulfate 220 (50 Zn) MG capsule Take 220 mg by mouth daily.      Objective: BP (!) 110/93   Pulse 67   Temp 98 F (36.7 C) (Oral)   Resp 16   Ht 6' (1.829 m)   Wt (!) 340 lb (154.2 kg)   SpO2 97%   BMI 46.11 kg/m  Exam: General: No acute distress, morbidly obese Eyes: Anicteric, PERRLA, extraocular  motions intact ENTM: Moist mucous membranes, no oropharyngeal lesions Neck: Supple, no JVD Cardiovascular: Regular rate and rhythm, no murmurs rubs or gallops Respiratory: Clear to auscultation bilaterally, normal work of breathing Gastrointestinal: Large central pannus, mild distention, nontender, no peritoneal fluid wave MSK: 5 out of 5 strength throughout, trace to +1 pitting edema lower extremities to midcalf, 2+ dorsalis pedis Derm: Warm and dry Neuro: Alert and oriented x3, does have some lapses in history, baseline facial droop from prior CVA Psych: Appropriate affect and demeanor  Labs and Imaging: CBC BMET  Recent Labs  Lab 12/20/17 1120  WBC 3.3*  HGB 7.7*  HCT 24.7*  PLT 99*   Recent Labs  Lab 12/20/17 1120  NA 140  K 4.2  CL 105  CO2 26  BUN 35*  CREATININE 3.10*  GLUCOSE 144*  CALCIUM 8.3*     Troponin <0.03 x1  EKG normal sinus rhythm  Ct Abdomen Pelvis Wo Contrast  Result Date: 12/20/2017 CLINICAL DATA:  Weakness. EXAM: CT ABDOMEN AND PELVIS WITHOUT CONTRAST TECHNIQUE: Multidetector CT imaging of the abdomen and pelvis was performed following the standard protocol without IV contrast. COMPARISON:  CT scan of September 27, 2017. FINDINGS: Lower chest: No acute abnormality. Hepatobiliary: Status post colectomy. Nodular hepatic margins are noted concerning for hepatic cirrhosis. No significant biliary dilatation is noted. Pancreas: Unremarkable. No pancreatic ductal dilatation or surrounding inflammatory changes. Spleen: Severe splenomegaly is noted. Adrenals/Urinary Tract: Adrenal glands appear normal. 4 cm rounded low density is seen arising from upper pole of right kidney with Hounsfield measurement of 11, most consistent with cyst. Stable 2.4 cm partially exophytic cyst is seen arising posteriorly from lower pole of right kidney. No hydronephrosis or renal obstruction is noted. No renal or ureteral calculi are noted. Urinary bladder is decompressed.  Stomach/Bowel: The stomach appears normal. There is no evidence of bowel obstruction or inflammation. Status post appendectomy. Vascular/Lymphatic: Atherosclerosis of abdominal aorta is noted without aneurysm formation. Stable adenopathy is noted in porta hepatis region which most likely is due to hepatic cirrhosis. Reproductive: Prostate is unremarkable. Other: Minimal to mild ascites is noted. This is decreased compared to prior exam. Musculoskeletal: No acute or significant osseous findings. IMPRESSION: Hepatic cirrhosis with severe splenomegaly. Minimal to mild ascites is noted  which is significantly improved compared to prior exam. Stable right renal cysts are noted. Aortic Atherosclerosis (ICD10-I70.0). Electronically Signed   By: Lupita Raider, M.D.   On: 12/20/2017 15:28   Ct Head Wo Contrast  Result Date: 12/20/2017 CLINICAL DATA:  Minor head trauma, legs giving way, weaker than normal for 3 weeks, history CHF, chronic kidney disease, diabetes mellitus, cirrhosis EXAM: CT HEAD WITHOUT CONTRAST TECHNIQUE: Contiguous axial images were obtained from the base of the skull through the vertex without intravenous contrast. Sagittal and coronal MPR images reconstructed from axial data set. COMPARISON:  11/14/2016 FINDINGS: Brain: Generalized atrophy. Normal ventricular morphology. No midline shift or mass effect. Minimal small vessel chronic ischemic changes of deep cerebral white matter. No intracranial hemorrhage, mass lesion, or evidence of acute infarction. No extra-axial fluid collections. Vascular: No hyperdense vessels. Skull: Demineralized but intact Sinuses/Orbits: Paranasal sinuses and mastoid air cells clear Other: N/A IMPRESSION: Atrophy with minimal small vessel chronic ischemic changes of deep cerebral white matter. No acute intracranial abnormalities. Electronically Signed   By: Ulyses Southward M.D.   On: 12/20/2017 15:07   Dg Chest Portable 1 View  Result Date: 12/20/2017 CLINICAL DATA:   Generalized weakness over the last 3 weeks, history of CHF and diabetes EXAM: PORTABLE CHEST 1 VIEW COMPARISON:  Chest x-ray of 09/26/2017 FINDINGS: There is little change in moderate cardiomegaly. No definite pneumonia or pleural effusion is seen although a small left effusion would be difficult to exclude. Very minimal pulmonary vascular congestion may be present. No bony abnormality is seen. IMPRESSION: 1. Stable cardiomegaly. 2. Question mild pulmonary vascular congestion. Electronically Signed   By: Dwyane Dee M.D.   On: 12/20/2017 11:27    Garnette Gunner, MD 12/20/2017, 3:30 PM PGY-1, [facility] Family Medicine FPTS Intern pager: [phone], text pages welcome  Upper Level Addendum: I have seen and evaluated this patient along with Dr. Janee Morn and reviewed the above note, making necessary revisions in blue.  Durward Parcel, DO [facility] Family Medicine, PGY-2

## 2017-12-20 NOTE — ED Triage Notes (Signed)
Pt to ED via [employer], from home-- states called 911 because "legs give away, I am weaker than normal" states has been going on x 3 weeks, approx. Unable to get into the [place] clinic in [place].

## 2017-12-20 NOTE — ED Notes (Signed)
Urine Culture sent down with UA to Main Lab with instructions to hold until orders are placed.

## 2017-12-21 ENCOUNTER — Observation Stay (HOSPITAL_BASED_OUTPATIENT_CLINIC_OR_DEPARTMENT_OTHER): Payer: Non-veteran care

## 2017-12-21 DIAGNOSIS — R55 Syncope and collapse: Secondary | ICD-10-CM

## 2017-12-21 DIAGNOSIS — R188 Other ascites: Secondary | ICD-10-CM

## 2017-12-21 DIAGNOSIS — N289 Disorder of kidney and ureter, unspecified: Secondary | ICD-10-CM

## 2017-12-21 DIAGNOSIS — K7460 Unspecified cirrhosis of liver: Secondary | ICD-10-CM

## 2017-12-21 DIAGNOSIS — W19XXXA Unspecified fall, initial encounter: Secondary | ICD-10-CM | POA: Diagnosis not present

## 2017-12-21 LAB — MAGNESIUM: Magnesium: 1.8 mg/dL (ref 1.7–2.4)

## 2017-12-21 LAB — LIPID PANEL
CHOL/HDL RATIO: 3.7 ratio
Cholesterol: 126 mg/dL (ref 0–200)
HDL: 34 mg/dL — ABNORMAL LOW (ref >40)
LDL Cholesterol: 80 mg/dL (ref 0–99)
Triglycerides: 59 mg/dL (ref <150)
VLDL: 12 mg/dL (ref 0–40)

## 2017-12-21 LAB — VITAMIN B12: Vitamin B-12: 746 pg/mL (ref 180–914)

## 2017-12-21 LAB — COMPREHENSIVE METABOLIC PANEL
ALT: 17 U/L (ref 17–63)
ANION GAP: 8 (ref 5–15)
AST: 29 U/L (ref 15–41)
Albumin: 2.4 g/dL — ABNORMAL LOW (ref 3.5–5.0)
Alkaline Phosphatase: 67 U/L (ref 38–126)
BUN: 31 mg/dL — ABNORMAL HIGH (ref 6–20)
CALCIUM: 8.4 mg/dL — AB (ref 8.9–10.3)
CHLORIDE: 104 mmol/L (ref 101–111)
CO2: 27 mmol/L (ref 22–32)
CREATININE: 2.7 mg/dL — AB (ref 0.61–1.24)
GFR, EST AFRICAN AMERICAN: 25 mL/min — AB (ref >60)
GFR, EST NON AFRICAN AMERICAN: 22 mL/min — AB (ref >60)
Glucose, Bld: 174 mg/dL — ABNORMAL HIGH (ref 65–99)
Potassium: 4.1 mmol/L (ref 3.5–5.1)
SODIUM: 139 mmol/L (ref 135–145)
Total Bilirubin: 1.1 mg/dL (ref 0.3–1.2)
Total Protein: 6.6 g/dL (ref 6.5–8.1)

## 2017-12-21 LAB — HEMOGLOBIN A1C
HEMOGLOBIN A1C: 7 % — AB (ref 4.8–5.6)
MEAN PLASMA GLUCOSE: 154.2 mg/dL

## 2017-12-21 LAB — CBG MONITORING, ED
GLUCOSE-CAPILLARY: 100 mg/dL — AB (ref 65–99)
GLUCOSE-CAPILLARY: 202 mg/dL — AB (ref 65–99)
Glucose-Capillary: 83 mg/dL (ref 65–99)

## 2017-12-21 LAB — ECHOCARDIOGRAM COMPLETE
HEIGHTINCHES: 72 in
Weight: 5440 oz

## 2017-12-21 LAB — TROPONIN I: Troponin I: <0.03 ng/mL (ref <0.03)

## 2017-12-21 LAB — IRON AND TIBC
IRON: 108 ug/dL (ref 45–182)
Saturation Ratios: 35 % (ref 17.9–39.5)
TIBC: 307 ug/dL (ref 250–450)
UIBC: 199 ug/dL

## 2017-12-21 LAB — CK: Total CK: 54 U/L (ref 49–397)

## 2017-12-21 LAB — FERRITIN: Ferritin: 23 ng/mL — ABNORMAL LOW (ref 24–336)

## 2017-12-21 LAB — CBC
HCT: 24.3 % — ABNORMAL LOW (ref 39.0–52.0)
HEMOGLOBIN: 7.6 g/dL — AB (ref 13.0–17.0)
MCH: 28 pg (ref 26.0–34.0)
MCHC: 31.3 g/dL (ref 30.0–36.0)
MCV: 89.7 fL (ref 78.0–100.0)
PLATELETS: 120 10*3/uL — AB (ref 150–400)
RBC: 2.71 MIL/uL — AB (ref 4.22–5.81)
RDW: 16.5 % — ABNORMAL HIGH (ref 11.5–15.5)
WBC: 3.2 10*3/uL — ABNORMAL LOW (ref 4.0–10.5)

## 2017-12-21 LAB — AMMONIA: AMMONIA: 67 umol/L — AB (ref 9–35)

## 2017-12-21 LAB — PHOSPHORUS: Phosphorus: 4.1 mg/dL (ref 2.5–4.6)

## 2017-12-21 LAB — BRAIN NATRIURETIC PEPTIDE: B NATRIURETIC PEPTIDE 5: 80.2 pg/mL (ref 0.0–100.0)

## 2017-12-21 LAB — RPR: RPR: NONREACTIVE

## 2017-12-21 LAB — GLUCOSE, CAPILLARY
GLUCOSE-CAPILLARY: 175 mg/dL — AB (ref 65–99)
Glucose-Capillary: 137 mg/dL — ABNORMAL HIGH (ref 65–99)

## 2017-12-21 LAB — HIV ANTIBODY (ROUTINE TESTING W REFLEX): HIV SCREEN 4TH GENERATION: NONREACTIVE

## 2017-12-21 LAB — TSH: TSH: 1.052 u[IU]/mL (ref 0.350–4.500)

## 2017-12-21 LAB — FOLATE: FOLATE: 36.4 ng/mL (ref >5.9)

## 2017-12-21 MED ORDER — SERTRALINE HCL 100 MG PO TABS
100.0000 mg | ORAL_TABLET | Freq: Every day | ORAL | Status: DC
  Administered 2017-12-21 – 2017-12-22 (×2): 100 mg via ORAL
  Filled 2017-12-21 (×2): qty 1

## 2017-12-21 NOTE — ED Triage Notes (Signed)
Waiting for meal tray.

## 2017-12-21 NOTE — Progress Notes (Signed)
  Echocardiogram 2D Echocardiogram has been performed.  Logan Cole 12/21/2017, 3:46 PM

## 2017-12-21 NOTE — Evaluation (Signed)
Physical Therapy Evaluation Patient Details Name: Logan Cole MRN: [id number] DOB: 03/21/1946 Today's Date: 12/21/2017   History of Present Illness  Pt is a 72 y/o male admitted secondary to weakness and multiple falls. CT of head negative for acute abnormality; CT of abdomen showed hepatic cirrhosis with severe splenomegal. PMH includes liver cirrhosis, CKD3, CVA, obesity, peripheral neuropathy, and DM.   Clinical Impression  Pt admitted secondary to problem above and deficits below. Pt limited secondary to dizziness and orthostatic BPs (see below). Only able to tolerate standing at EOB. Anticipate pt will progress well once dizziness controlled, however, if pt does not progress may need to consider SNF. Will progress mobility and update recommendations as needed. Will continue to follow acutely to maximize functional mobility independence and safety.   Orthostatic BPs:  Lying: 126/45 mmHg; 65 bpm Seated: 116/59 mmHg; 66 bpm Standing: 95/47 mmHg; 69 bpm Standing 3 min: unable to tolerate Pt's BP returned to 147/53 mmHg upon return to supine    Follow Up Recommendations Home health PT;Supervision for mobility/OOB    Equipment Recommendations  None recommended by PT    Recommendations for Other Services       Precautions / Restrictions Precautions Precautions: Fall;Other (comment) Precaution Comments: Watch BP  Restrictions Weight Bearing Restrictions: No      Mobility  Bed Mobility Overal bed mobility: Needs Assistance Bed Mobility: Supine to Sit;Sit to Supine     Supine to sit: Min assist Sit to supine: Supervision   General bed mobility comments: Min A for trunk elevation to come to sitting. Supervision and increased time required for return to supine.   Transfers Overall transfer level: Needs assistance Equipment used: Rolling walker (2 wheeled) Transfers: Sit to/from Stand Sit to Stand: Min assist         General transfer comment: Min A for lift assist. Pt  with increased dizziness and orthostatic BPs (see vitals flowsheet), therefore further mobility deferred.   Ambulation/Gait             General Gait Details: Deferred. Pt did report he has ambulated to bathroom during stay.   Stairs            Wheelchair Mobility    Modified Rankin (Stroke Patients Only)       Balance Overall balance assessment: Needs assistance Sitting-balance support: No upper extremity supported;Feet supported Sitting balance-Leahy Scale: Good     Standing balance support: Bilateral upper extremity supported;During functional activity Standing balance-Leahy Scale: Poor Standing balance comment: Reliant on BUE support.                             Pertinent Vitals/Pain Pain Assessment: 0-10 Pain Score: 8  Pain Location: back and LEs Pain Descriptors / Indicators: Aching Pain Intervention(s): Limited activity within patient's tolerance;Monitored during session;Repositioned    Home Living Family/patient expects to be discharged to:: Private residence Living Arrangements: Spouse/significant other Available Help at Discharge: Family;Available PRN/intermittently Type of Home: House Home Access: Ramped entrance     Home Layout: One level Home Equipment: Walker - 2 wheels;Cane - single point      Prior Function Level of Independence: Needs assistance   Gait / Transfers Assistance Needed: Reports he uses RW and cane for ambulation.   ADL's / Homemaking Assistance Needed: Reports he needed assist from wife for bathing/dressing.         Hand Dominance        Extremity/Trunk Assessment   Upper Extremity  Assessment Upper Extremity Assessment: Defer to OT evaluation    Lower Extremity Assessment Lower Extremity Assessment: Generalized weakness    Cervical / Trunk Assessment Cervical / Trunk Assessment: Normal  Communication   Communication: No difficulties  Cognition Arousal/Alertness: Awake/alert Behavior During  Therapy: WFL for tasks assessed/performed Overall Cognitive Status: No family/caregiver present to determine baseline cognitive functioning                                 General Comments: Slowed processing noted, and pt slow to answer questions.       General Comments      Exercises     Assessment/Plan    PT Assessment Patient needs continued PT services  PT Problem List Cardiopulmonary status limiting activity;Decreased strength;Decreased balance;Decreased activity tolerance;Decreased mobility;Decreased knowledge of use of DME;Decreased knowledge of precautions       PT Treatment Interventions DME instruction;Gait training;Functional mobility training;Therapeutic activities;Therapeutic exercise;Balance training;Patient/family education    PT Goals (Current goals can be found in the Care Plan section)  Acute Rehab PT Goals Patient Stated Goal: to go home  PT Goal Formulation: With patient Time For Goal Achievement: 01/04/18 Potential to Achieve Goals: Fair    Frequency Min 3X/week   Barriers to discharge Decreased caregiver support      Co-evaluation               AM-PAC PT "6 Clicks" Daily Activity  Outcome Measure Difficulty turning over in bed (including adjusting bedclothes, sheets and blankets)?: A Lot Difficulty moving from lying on back to sitting on the side of the bed? : Unable Difficulty sitting down on and standing up from a chair with arms (e.g., wheelchair, bedside commode, etc,.)?: Unable Help needed moving to and from a bed to chair (including a wheelchair)?: A Little Help needed walking in hospital room?: A Lot Help needed climbing 3-5 steps with a railing? : A Lot 6 Click Score: 11    End of Session Equipment Utilized During Treatment: Gait belt Activity Tolerance: Treatment limited secondary to medical complications (Comment)(orthostatics ) Patient left: in bed;with call bell/phone within reach Nurse Communication: Mobility  status;Other (comment)(pt requesting meds for indigestion ) PT Visit Diagnosis: Difficulty in walking, not elsewhere classified (R26.2);Muscle weakness (generalized) (M62.81);History of falling (Z91.81)    Time: 1308-6578 PT Time Calculation (min) (ACUTE ONLY): 21 min   Charges:   PT Evaluation $PT Eval Moderate Complexity: 1 Mod     PT G Codes:        Gladys Damme, PT, DPT  Acute Rehabilitation Services  Pager: [phone]   Lehman Prom 12/21/2017, 11:17 AM

## 2017-12-21 NOTE — ED Notes (Signed)
Went to room to [employer] and insulin. Pt in RR at this time, states he would like this RN to wait to give meds and will call once out of RR.

## 2017-12-21 NOTE — Progress Notes (Signed)
Attempted echo.  Patient felt like his sugar was low and wanted to hold off on echo until after breakfast.

## 2017-12-21 NOTE — ED Triage Notes (Signed)
Meal delivered

## 2017-12-21 NOTE — ED Notes (Signed)
ECHO being done at bedside at this time.

## 2017-12-21 NOTE — Care Management Obs Status (Signed)
MEDICARE OBSERVATION STATUS NOTIFICATION   Patient Details  Name: Logan Cole MRN: [id number] Date of Birth: Apr 29, 1946   Medicare Observation Status Notification Given:  Yes    Yancey Flemings, RN 12/21/2017, 5:06 PM

## 2017-12-21 NOTE — Progress Notes (Addendum)
Family Medicine Teaching Service Daily Progress Note Intern Pager: [phone]  Patient name: Logan Cole Medical record number: [id number] Date of birth: 01-23-1946 Age: 72 y.o. Gender: male  Primary Care Provider: Sherwood Gambler, MD Consultants: None Code Status: Full  Pt Overview and Major Events to Date:  Admitted 05/15  Assessment and Plan: Logan Cole is a 72 y.o. male presenting with 3 weeks of progressive weakness and multiple falls. PMH is significant for liver cirrhosis, CKD 3, HFpEF, morbid obesity, history of prior CVA, diabetes mellitus with peripheral neuropathy, depression with anxiety, hemorrhagic right renal cyst   Progressive weakness, acute on chronic  Falls Progressive weakness falls over the past 3 weeks. No chest pain, shortness of breath associate with events.  EKG with NRS and prolonged QT and troponins have been negative. CT head showed chronic vascular disease, no acute findings. CT abdomen cirrhosis with splenic megaly with ascites, significantly improved over prior examination in 09/2017.  Patient is currently on multiple anticholinergic medications including hydroxyzine, Claritin, Paxil.  Also on prazosin which can cause worsening hypertension due to alpha blockade.  Also on sedating medications oxycodone 10 mg 4 times daily, gabapentin. Patient does not seem to understand all of the medications that he is on. Patient appears to have some baseline dementia, and will obtain a Moca score. Likely multifactorial process for medications.   -hold home anticholinergic medications including hydralazine, Claritin, Paxil> will start sertraline  per recommendations for dose change -Hold home prazosin -Hold home gabapentin and decreased oxycodone to 5 mg 4 times daily. -PT/OT/SLP for Moca score -Orthostatic vital signs -Trend troponins, repeat a.m. EKG - ECHO - Continuous pulse ox - Cardiac monitoring  Acute on CKD3, improving Creatinine on admission 3.1>2.7,  baseline 1.5-1.8.  Most likely prerenal in setting of decreased p.o.  -Trend creatinine -Hold home nephro toxic agents Lasix, spironolactone-BP this a.m. 148/62  History of  hemorrhagic cyst  Abdominal CT shows stable 4 cm rounded density in the upper right kidney and stable 2.4 cm cyst on the lower right kidney.  Findings are without obstruction.  Appearance is consistent with scan on zero 09/2017.   -Recommend monitoring with MRI in outpatient setting   Liver cirrhosis with pancytopenia LFTs stable to baseline.  Albumin 2.3.  Patient has pancytopenia with WBC 3, hemoglobin 7.7, platelets 99.  Currently takes rifampin, does not take lactulose.  Prior hep C negative in 09/2017.  Most likely fatty liver disease causing cirrhosis, however unsure if full cause. -PT INR -Continue home rifampin -[facility] -Holding Lasix, spironolactone in setting of AKI  Diabetes mellitus with peripheral nephropathy Patient takes 50 units of Lantus twice daily, 25 units of aspart 3 times daily with meals.  Last A1c was 6 in 09/2017. -25 units Lantus twice daily -Sliding scale sensitive with every 4 CBG, will likely need more -Repeat A1c  History of CVA Baseline facial droop, no residual neuro findings.  Head CT negative for stroke. -Continue home Plavix  ? HFpEF Questionable diagnosis of heart failure diastolic during last admission.  Patient had EF of 60-65% in 09/2017 without valvular or wall motion normalities.  Patient is not on ACE inhibitor.  Does not appear overtly fluid overloaded on admission. Arrhythmia could contribute to falls though patient NSR on admission. -Continue home metoprolol -Strict I's and O's- output yesterday -Hold Lasix, spironolactone in setting of AKI -Daily weights  Depression/ Anxiety: Chronic, stable.  -Paxil> will start sertraline  per recommendations for dose change  FEN/GI: Carb modified diet Prophylaxis: SCD,  holding any coagulation setting of thrombocytopenia  Disposition: continued stay   Subjective:  Patient states that he does not feel himself this morning.  Reports that he has been dizzy every time he tried to get up out of bed.  Patient denies chest pain and reports some shortness of breath.  Reports that he uses DuoNeb's multiple times a day at home.  Objective: Temp:  [98 F (36.7 C)] 98 F (36.7 C) (05/15 1104) Pulse Rate:  [61-76] 61 (05/16 0700) Resp:  [11-22] 18 (05/16 0700) BP: (90-157)/(42-93) 148/62 (05/16 0700) SpO2:  [82 %-100 %] 100 % (05/16 0755) Weight:  [340 lb (154.2 kg)] 340 lb (154.2 kg) (05/15 1037) Physical Exam: General: NAD, pleasant, laying in bed Cardiovascular: RRR, 3/6 murmur over aortic listening post, no LE edema Respiratory: Minimal wheezing throughout, with crackles in bilateral bases, normal work of breathing Gastrointestinal: soft, nontender, nondistended, normoactive BS MSK: moves 4 extremities equally Derm: no rashes appreciated Neuro: CN II-XII grossly intact Psych: AO, appropriate affect  Laboratory: Recent Labs  Lab 12/20/17 1120  WBC 3.3*  HGB 7.7*  HCT 24.7*  PLT 99*   Recent Labs  Lab 12/20/17 1120  NA 140  K 4.2  CL 105  CO2 26  BUN 35*  CREATININE 3.10*  CALCIUM 8.3*  PROT 6.9  BILITOT 0.7  ALKPHOS 65  ALT 15*  AST 32  GLUCOSE 144*   BNP 80 Ammonia 67 TSH 1.052 A1c 7.0 Vit B12 746 Folate 36.4 Mag 1.8 Phos 4.1 Albumin 2.3 UA neg   Imaging/Diagnostic Tests: Ct Abdomen Pelvis Wo Contrast  Result Date: 12/20/2017 CLINICAL DATA:  Weakness. EXAM: CT ABDOMEN AND PELVIS WITHOUT CONTRAST TECHNIQUE: Multidetector CT imaging of the abdomen and pelvis was performed following the standard protocol without IV contrast. COMPARISON:  CT scan of September 27, 2017. FINDINGS: Lower chest: No acute abnormality. Hepatobiliary: Status post colectomy. Nodular hepatic margins are noted concerning for hepatic cirrhosis. No  significant biliary dilatation is noted. Pancreas: Unremarkable. No pancreatic ductal dilatation or surrounding inflammatory changes. Spleen: Severe splenomegaly is noted. Adrenals/Urinary Tract: Adrenal glands appear normal. 4 cm rounded low density is seen arising from upper pole of right kidney with Hounsfield measurement of 11, most consistent with cyst. Stable 2.4 cm partially exophytic cyst is seen arising posteriorly from lower pole of right kidney. No hydronephrosis or renal obstruction is noted. No renal or ureteral calculi are noted. Urinary bladder is decompressed. Stomach/Bowel: The stomach appears normal. There is no evidence of bowel obstruction or inflammation. Status post appendectomy. Vascular/Lymphatic: Atherosclerosis of abdominal aorta is noted without aneurysm formation. Stable adenopathy is noted in porta hepatis region which most likely is due to hepatic cirrhosis. Reproductive: Prostate is unremarkable. Other: Minimal to mild ascites is noted. This is decreased compared to prior exam. Musculoskeletal: No acute or significant osseous findings. IMPRESSION: Hepatic cirrhosis with severe splenomegaly. Minimal to mild ascites is noted which is significantly improved compared to prior exam. Stable right renal cysts are noted. Aortic Atherosclerosis (ICD10-I70.0). Electronically Signed   By: Lupita Raider, M.D.   On: 12/20/2017 15:28   Ct Head Wo Contrast  Result Date: 12/20/2017 CLINICAL DATA:  Minor head trauma, legs giving way, weaker than normal for 3 weeks, history CHF, chronic kidney disease, diabetes mellitus, cirrhosis EXAM: CT HEAD WITHOUT CONTRAST TECHNIQUE: Contiguous axial images were obtained from the base of the skull through the vertex without intravenous contrast. Sagittal and coronal MPR images reconstructed from axial  data set. COMPARISON:  11/14/2016 FINDINGS: Brain: Generalized atrophy. Normal ventricular morphology. No midline shift or mass effect. Minimal small vessel  chronic ischemic changes of deep cerebral white matter. No intracranial hemorrhage, mass lesion, or evidence of acute infarction. No extra-axial fluid collections. Vascular: No hyperdense vessels. Skull: Demineralized but intact Sinuses/Orbits: Paranasal sinuses and mastoid air cells clear Other: N/A IMPRESSION: Atrophy with minimal small vessel chronic ischemic changes of deep cerebral white matter. No acute intracranial abnormalities. Electronically Signed   By: Ulyses Southward M.D.   On: 12/20/2017 15:07   Dg Chest Portable 1 View  Result Date: 12/20/2017 CLINICAL DATA:  Generalized weakness over the last 3 weeks, history of CHF and diabetes EXAM: PORTABLE CHEST 1 VIEW COMPARISON:  Chest x-ray of 09/26/2017 FINDINGS: There is little change in moderate cardiomegaly. No definite pneumonia or pleural effusion is seen although a small left effusion would be difficult to exclude. Very minimal pulmonary vascular congestion may be present. No bony abnormality is seen. IMPRESSION: 1. Stable cardiomegaly. 2. Question mild pulmonary vascular congestion. Electronically Signed   By: Dwyane Dee M.D.   On: 12/20/2017 11:27    Justeen Hehr, [place], DO 12/21/2017, 9:49 AM PGY-1, [facility] FPTS Intern pager: [phone], text pages welcome

## 2017-12-21 NOTE — ED Triage Notes (Signed)
Phlebotomy called for labwork.

## 2017-12-21 NOTE — ED Notes (Signed)
Checked patient cbg it was 63 notified RN of blood sugar gave patient crackers and a ginger ale patient is resting with call bell in reach

## 2017-12-21 NOTE — Progress Notes (Signed)
Admission note:  Arrival Method: Patient arrived in bed from ED. Mental Orientation:  Alert and oriented x 4, on and off. Telemetry: Tele 84M-05, NSR Assessment:  See doc flow sheets Skin: Bruising noted on bilateral upper extremities and also on the right hip. IV: Right wrist SL. Pain: denies any currently. Tubes: N/A Safety Measures: Bed in low position, call bell and phone within reach. Fall Prevention Safety Plan: Reviewed the plan, verbalized understanding. Admission Screening: IN progress. 6700 Orientation: Patient has been oriented to the unit, staff and to the room.

## 2017-12-21 NOTE — ED Notes (Signed)
Got patient up use the urinal patient is resting with call bell in reach

## 2017-12-21 NOTE — Progress Notes (Addendum)
NCM in to speak with patient discussed Request & Authorization for medical records etc. From [facility].  Patient verbalized understanding.  Request faxed to [facility], attention to Social Worker Victorino Dike Mischler/Dr. Gery Pray.  Patient also request information regarding "Living Will", NCM sent request for referral (chaplain).

## 2017-12-22 DIAGNOSIS — N289 Disorder of kidney and ureter, unspecified: Secondary | ICD-10-CM | POA: Diagnosis not present

## 2017-12-22 DIAGNOSIS — W19XXXA Unspecified fall, initial encounter: Secondary | ICD-10-CM

## 2017-12-22 LAB — CBC
HCT: 24.8 % — ABNORMAL LOW (ref 39.0–52.0)
Hemoglobin: 7.9 g/dL — ABNORMAL LOW (ref 13.0–17.0)
MCH: 28.6 pg (ref 26.0–34.0)
MCHC: 31.9 g/dL (ref 30.0–36.0)
MCV: 89.9 fL (ref 78.0–100.0)
Platelets: 124 10*3/uL — ABNORMAL LOW (ref 150–400)
RBC: 2.76 MIL/uL — ABNORMAL LOW (ref 4.22–5.81)
RDW: 16.3 % — ABNORMAL HIGH (ref 11.5–15.5)
WBC: 3.6 10*3/uL — ABNORMAL LOW (ref 4.0–10.5)

## 2017-12-22 LAB — BASIC METABOLIC PANEL
Anion gap: 7 (ref 5–15)
BUN: 26 mg/dL — AB (ref 6–20)
CHLORIDE: 105 mmol/L (ref 101–111)
CO2: 26 mmol/L (ref 22–32)
CREATININE: 2.32 mg/dL — AB (ref 0.61–1.24)
Calcium: 8.7 mg/dL — ABNORMAL LOW (ref 8.9–10.3)
GFR calc Af Amer: 31 mL/min — ABNORMAL LOW (ref >60)
GFR calc non Af Amer: 26 mL/min — ABNORMAL LOW (ref >60)
GLUCOSE: 130 mg/dL — AB (ref 65–99)
Potassium: 4 mmol/L (ref 3.5–5.1)
Sodium: 138 mmol/L (ref 135–145)

## 2017-12-22 LAB — GLUCOSE, CAPILLARY
Glucose-Capillary: 106 mg/dL — ABNORMAL HIGH (ref 65–99)
Glucose-Capillary: 158 mg/dL — ABNORMAL HIGH (ref 65–99)

## 2017-12-22 MED ORDER — OXYCODONE HCL 5 MG PO TABS: 5.0000 mg | ORAL_TABLET | ORAL | 0 refills | Status: AC | PRN

## 2017-12-22 MED ORDER — PROCHLORPERAZINE MALEATE 5 MG PO TABS
5.0000 mg | ORAL_TABLET | Freq: Four times a day (QID) | ORAL | Status: DC | PRN
  Administered 2017-12-22: 5 mg via ORAL
  Filled 2017-12-22 (×2): qty 1

## 2017-12-22 MED ORDER — SERTRALINE HCL 100 MG PO TABS: 100.0000 mg | ORAL_TABLET | Freq: Every day | ORAL | 0 refills | Status: AC

## 2017-12-22 MED ORDER — POLYETHYLENE GLYCOL 3350 17 G PO PACK: 17.0000 g | PACK | Freq: Every day | ORAL | 0 refills | Status: AC | PRN

## 2017-12-22 NOTE — Progress Notes (Signed)
Nutrition Consult/Brief Note  RD consulted for assessment of nutrition status.  Wt Readings from Last 15 Encounters:  12/22/17 (!) 317 lb 14.5 oz (144.2 kg)  10/01/17 (!) 357 lb 6.4 oz (162.1 kg)   Body mass index is 43.12 kg/m. Patient meets criteria for Obesity Class III based on current BMI.   Current diet order is Carbohydrate Modified, patient is consuming approximately 100% of meals at this time.   Labs and medications reviewed. CBG's [phone].  No nutrition interventions warranted at this time. If nutrition issues arise, please consult RD.   Maureen Chatters, RD, LDN Pager #: [phone] After-Hours Pager #: [phone]

## 2017-12-22 NOTE — Progress Notes (Signed)
Family Medicine Teaching Service Daily Progress Note Intern Pager: [phone]  Patient name: Logan Cole Medical record number: [id number] Date of birth: 1946-07-03 Age: 72 y.o. Gender: male  Primary Care Provider: Sherwood Gambler, MD Consultants: None Code Status: Full  Pt Overview and Major Events to Date:  Admitted 05/15  Assessment and Plan: Logan Cole is a 72 y.o. male presenting with 3 weeks of progressive weakness and multiple falls. PMH is significant for liver cirrhosis, CKD 3, HFpEF, morbid obesity, history of prior CVA, diabetes mellitus with peripheral neuropathy, depression with anxiety, hemorrhagic right renal cyst   Progressive weakness, acute on chronic  Falls Likely due to polypharmacy. Patient appears to have some baseline dementia, and will obtain a Moca score. Likely multifactorial process for medications.   -hold home anticholinergic medications including hydralazine, Claritin, Paxil> continue sertraline -Hold home prazosin -Hold home gabapentin and decreased oxycodone to 5 mg 4 times daily. -PT/OT/SLP for Moca score -Orthostatic vital signs positive -Troponins neg x3, repeat a.m. EKG pending - ECHO is unchanged from previous - Continuous pulse ox - Cardiac monitoring  Acute on CKD3, improving Creatinine on admission 3.1>2.32, baseline 1.5-1.8.  Most likely prerenal in setting of decreased p.o.  -Trend creatinine -Hold home nephro toxic agents Lasix, spironolactone-BP this a.m. 148/62  Liver cirrhosis with pancytopenia LFTs stable to baseline.  Albumin 2.4.  Patient has pancytopenia with WBC 3.6, hemoglobin 7.9, platelets 124.  Currently takes rifampin, does not take lactulose.  Prior hep C negative in 09/2017.  Most likely fatty liver disease causing cirrhosis, however unsure if full cause. -PT INR 19.6/1.67 -Continue home rifampin -[facility] -Holding Lasix, spironolactone in setting of AKI  Diabetes  mellitus with peripheral neuropathy Patient takes 50 units of Lantus twice daily, 25 units of aspart 3 times daily with meals. A1c is 7. -25 units Lantus twice daily -Sliding scale sensitive with every 4 CBG, will likely need more  History of CVA Baseline facial droop, no residual neuro findings.  Head CT negative for stroke. -Continue home Plavix  History of  hemorrhagic cyst  Abdominal CT shows stable 4 cm rounded density in the upper right kidney and stable 2.4 cm cyst on the lower right kidney.  Findings are without obstruction.  Appearance is consistent with scan on zero 09/2017.   -Recommend monitoring with MRI in outpatient setting   ? HFpEF Questionable diagnosis of heart failure during last admission.  Patient has EF of 60-65% on repeat ECHO 5/16 without valvular or wall motion normalities. Arrhythmia could contribute to falls though patient NSR on admission. -Continue home metoprolol -Strict I's and O's -Hold Lasix, spironolactone in setting of AKI -Daily weights  Depression/ Anxiety: Chronic, stable.  -Paxil> will start sertraline  per recommendations for dose change  FEN/GI: Carb modified diet Prophylaxis: SCD, holding any coagulation setting of thrombocytopenia  Disposition: stable for discharge   Subjective:  Patient smiling with no complaints this morning other than being here, he would like to go home. Says he feels much much better and has compression stockings at home he can start wearing  Objective: Temp:  [98.4 F (36.9 C)-98.6 F (37 C)] 98.5 F (36.9 C) (05/17 0425) Pulse Rate:  [62-66] 63 (05/17 0425) Resp:  [12-20] 19 (05/16 2033) BP: (138-159)/(43-92) 146/57 (05/17 0425) SpO2:  [97 %-100 %] 98 % (05/17 0425) Weight:  [317 lb 14.5 oz (144.2 kg)-318 lb (144.2 kg)] 317 lb 14.5 oz (144.2 kg) (05/17 0448) Physical Exam: General: NAD, pleasant, laying  in bed Cardiovascular: RRR, no m/r/g, no LE edema Respiratory: CTA BL, normal work of  breathing Gastrointestinal: soft, tender over RUQ, mildly distended, normoactive BS MSK: moves 4 extremities equally, SCDs in place Derm: no rashes appreciated Neuro: CN II-XII grossly intact Psych: AOx3, appropriate affect   Laboratory: Recent Labs  Lab 12/20/17 1120 12/21/17 1030 12/22/17 0523  WBC 3.3* 3.2* 3.6*  HGB 7.7* 7.6* 7.9*  HCT 24.7* 24.3* 24.8*  PLT 99* 120* 124*   Recent Labs  Lab 12/20/17 1120 12/21/17 1030 12/22/17 0523  NA 140 139 138  K 4.2 4.1 4.0  CL 105 104 105  CO2 BUN 35* 31* 26*  CREATININE 3.10* 2.70* 2.32*  CALCIUM 8.3* 8.4* 8.7*  PROT 6.9 6.6  --   BILITOT 0.7 1.1  --   ALKPHOS 65 67  --   ALT 15* 17  --   AST 32 29  --   GLUCOSE 144* 174* 130*   BNP 80 Ammonia 67 TSH 1.052 A1c 7.0 Vit B12 746 Folate 36.4 Mag 1.8 Phos 4.1 Albumin 2.3 UA neg Iron/TIBC/Ferritin/ %Sat    Component Value Date/Time   IRON 108 12/21/2017 1704   TIBC 307 12/21/2017 1704   FERRITIN 23 (L) 12/21/2017 1704   IRONPCTSAT 35 12/21/2017 1704    Imaging/Diagnostic Tests: No results found.  Jennie Hannay, [place], DO 12/22/2017, 9:45 AM PGY-1,  Family Medicine FPTS Intern pager: [phone], text pages welcome

## 2017-12-22 NOTE — Progress Notes (Signed)
Patient Discharge: Disposition: Patient discharged to home. Education: Reviewed medications, prescriptions, follow-up appointments and discharge instructions with family and patient, verbalized understanding. IV: Discontinued IV. Telemetry: Discontinued Tele before discharge. Transportation: Escorted out of the unit in w/c. Belongings: Patient took all his belongings with him.

## 2017-12-22 NOTE — Discharge Summary (Signed)
Family Medicine Teaching [facility] Discharge Summary  Patient name: Logan Cole Medical record number: [id number] Date of birth: 1946/06/23 Age: 72 y.o. Gender: male Date of Admission: 12/20/2017  Date of Discharge: 10/22/17 Admitting Physician: Garnette Gunner, MD  Primary Care Provider: Sherwood Gambler, MD Consultants: none  Indication for Hospitalization: progressive weakness and falls  Discharge Diagnoses/Problem List:  Progressive weakness, acute on chronic Falls Acute on CKD3, improving Liver cirrhosis with pancytopenia Diabetes mellituswith peripheral neuropathy History of CVA Depression/ Anxiety History ofhemorrhagic cyst   Disposition: stable  Discharge Condition: home with [facility]  Discharge Exam:  General: NAD, pleasant, laying in bed Cardiovascular: RRR, no m/r/g, no LE edema Respiratory: CTA BL, normal work of breathing Gastrointestinal: soft, tender over RUQ, mildly distended, normoactive BS MSK: moves 4 extremities equally, SCDs in place Derm: no rashes appreciated Neuro: CN II-XII grossly intact Psych: AOx3, appropriate affect  Brief Hospital Course:  This 72 yo male presented with progressive weakness falls over the past 3 weeks.  No prodrome symptoms associated. He did present initially mildly hypotensive at 90/50, however this improved to 110/70 with small fluid bolus. Work-up in ED mostly unremarkable including normal electrolytes, no pneumonia on chest x-ray, negative UA.  EKG showed NRS.  Troponin was negative.  CT head showed chronic vascular disease, no acute findings.  CT abdomen cirrhosis with splenic megaly with ascites, significantly improved over prior examination in 09/2017. Patient had multiple medications discontinued including lasix, furosemide, hydralazine, Claritin, prazosin, gabapentin. Decreased oxycodone to 5 mg 4 times daily. Patient's Paxil was discontinued and started on equivalent dosing of sertraline to reduce side effects.  Patient's orthostatic vitals were positive and after one day with change in medication had improved. Prior to discharge patient was much improved. 24 hour supervision was recommended by OT, however patient wants to go to [place] facility he previously was in and this will need to be pursued outpatient. HH PT and OT were ordered and he and family were encouraged to schedule follow up with primary care physician as soon as able due to medication changes.      Issues for Follow Up:  1. Patient had multiple medications discontinued including hydralazine, Claritin, prazosin, gabapentin. Decreased oxycodone to 5 mg 4 times daily. Patient's Paxil was discontinued and started on equivalent dosing of sertraline to reduce side effects. 2. Patient with some forgetfulness in hospital, consider work up for mild dementia. 3. Held Lasix and furosemide due to AKI, orthostatic hypotension, and no signs of fluid overload. Monitor creatinine due to AKI on admission, this was improving prior to discharge.  Significant Procedures: none  Significant Labs and Imaging:  Recent Labs  Lab 12/20/17 1120 12/21/17 1030 12/22/17 0523  WBC 3.3* 3.2* 3.6*  HGB 7.7* 7.6* 7.9*  HCT 24.7* 24.3* 24.8*  PLT 99* 120* 124*   Recent Labs  Lab 12/20/17 1120 12/21/17 0248 12/21/17 1030 12/22/17 0523  NA 140  --  139 138  K 4.2  --  4.1 4.0  CL 105  --  104 105  CO2 26  --  27 26  GLUCOSE 144*  --  174* 130*  BUN 35*  --  31* 26*  CREATININE 3.10*  --  2.70* 2.32*  CALCIUM 8.3*  --  8.4* 8.7*  MG  --  1.8  --   --   PHOS  --  4.1  --   --   ALKPHOS 65  --  67  --   AST 32  --  29  --   ALT 15*  --  17  --   ALBUMIN 2.3*  --  2.4*  --    BNP 80 Ammonia 67 TSH 1.052 A1c 7.0 Vit B12 746 Folate 36.4 Mag 1.8 Phos 4.1 Albumin 2.3 UA neg Iron/TIBC/Ferritin/ %Sat Labs(Brief)          Component Value Date/Time   IRON 108 12/21/2017 1704   TIBC 307 12/21/2017 1704   FERRITIN 23 (L) 12/21/2017 1704   IRONPCTSAT  35 12/21/2017 1704      Results/Tests Pending at Time of Discharge: none  Discharge Medications:  Allergies as of 12/22/2017      Reactions   Lescol [fluvastatin Sodium] Other (See Comments)   unspecified   Other Other (See Comments)   Non-steroidal anti-inflammatory   Vardenafil Other (See Comments)   unspecified   Morphine And Related Rash      Medication List    STOP taking these medications   furosemide 40 MG tablet Commonly known as:  LASIX   gabapentin 300 MG capsule Commonly known as:  NEURONTIN   hydrOXYzine 50 MG tablet Commonly known as:  ATARAX/VISTARIL   lisinopril 20 MG tablet Commonly known as:  PRINIVIL,ZESTRIL   loperamide 2 MG capsule Commonly known as:  IMODIUM   loratadine 10 MG tablet Commonly known as:  CLARITIN   PARoxetine 40 MG tablet Commonly known as:  PAXIL   prazosin 5 MG capsule Commonly known as:  MINIPRESS   spironolactone 25 MG tablet Commonly known as:  ALDACTONE   zinc sulfate 220 (50 Zn) MG capsule     TAKE these medications   ammonium lactate 12 % lotion Commonly known as:  LAC-HYDRIN Apply 1 application topically daily as needed for dry skin.   ARTIFICIAL SALIVA MT Use as directed 4 sprays in the mouth or throat daily as needed.   clopidogrel 75 MG tablet Commonly known as:  PLAVIX Take 75 mg by mouth daily.   clotrimazole 1 % cream Commonly known as:  LOTRIMIN Apply 1 application topically daily. To feet   eucerin cream Apply 1 application topically daily. To arms and legs   ferrous sulfate 325 (65 FE) MG tablet Take 325 mg by mouth daily.   folic acid 1 MG tablet Commonly known as:  FOLVITE Take 1 mg by mouth daily.   ipratropium-albuterol 0.5-2.5 (3) MG/3ML Soln Commonly known as:  DUONEB Take 3 mLs by nebulization every 6 (six) hours as needed (SOB).   LANTUS SOLOSTAR 100 UNIT/ML Solostar Pen Generic drug:  Insulin Glargine Inject 50 Units into the skin 2 (two) times daily.   metoprolol  tartrate 25 MG tablet Commonly known as:  LOPRESSOR Take 12.5 mg by mouth 2 (two) times daily.   NON FORMULARY Apply 1 application topically daily as needed ("Lanolin/Mineral Oil" unscented - as directed).   NOVOLOG FLEXPEN 100 UNIT/ML FlexPen Generic drug:  insulin aspart Inject 25 Units into the skin 3 (three) times daily with meals.   oxyCODONE 5 MG immediate release tablet Commonly known as:  Oxy IR/ROXICODONE Take 1 tablet (5 mg total) by mouth every 4 (four) hours as needed for severe pain. What changed:  how much to take   pantoprazole 40 MG tablet Commonly known as:  PROTONIX Take 40 mg by mouth daily.   polyethylene glycol packet Commonly known as:  MIRALAX / GLYCOLAX Take 17 g by mouth daily as needed for mild constipation.   rifaximin 550 MG Tabs tablet Commonly known as:  Burman Blacksmith  Take 550 mg by mouth 3 (three) times daily.   sertraline 100 MG tablet Commonly known as:  ZOLOFT Take 1 tablet (100 mg total) by mouth daily. Start taking on:  12/23/2017   zinc oxide 20 % ointment Apply 1 application topically 2 (two) times daily as needed for irritation.       Discharge Instructions: Please refer to Patient Instructions section of EMR for full details.  Patient was counseled important signs and symptoms that should prompt return to medical care, changes in medications, dietary instructions, activity restrictions, and follow up appointments.   Follow-Up Appointments: Follow-up Information    [facility] up.   Why:  PCP Dr. Leonie Douglas Contact information: [address] [place] [address] [phone]           Acsa Estey, [place], DO 12/22/2017, 2:49 PM PGY-1, [facility]

## 2017-12-22 NOTE — Care Management Note (Addendum)
Case Management Note  Patient Details  Name: Logan Cole MRN: [id number] Date of Birth: 30-Jan-1946  Subjective/Objective:     Admitted for Syncope. PCP noted. Prior to admission patient lived at home with wife.         Action/Plan: Discussed recommendation of home health PT recommendation with patient.  NCM provided Home Health Provider list to patient to review, advised to let nurse know when he has made a decision.  Patient verbalized understanding.  Home DME: Walker - 2 wheels;Cane - single point  2:59 PM NCM discussed choices of home health agencies in his area.  Patient does not have a preference, permission given to select one in his area.  Referral for home health Services for PT/OT/aide called to Hospital discharge coordinator "Tiffany" with [facility].    3:37 PM NCM spoke with [name] social Worker Rupert Stacks, LCSW advised of patient current admission and need of home Health services PT/OT at discharge.  Per Victorino Dike have [facility] call their Business office because patient should have an active referral on file for PT/OT evaluation and treat; tell [facility] if their is no active order they would need to speak with Shaune Pascal with [facility] to request home health services.  NCM called representative Tiffany and advised of the above information regarding HH PT/OT services, verbalized understanding.  [facility] also states she will send admission notification to primary care provider.  Expected Discharge Date:    12/22/2017              Expected Discharge Plan:  Home w Home Health Services  In-House Referral:   N/A  Discharge planning Services  CM Consult  Post Acute Care Choice:  Home Health Choice offered to:  Patient  DME Arranged:   N/A DME Agency:   N/A  HH Arranged:    PT/OT HH Agency:    [facility]  Status of Service:  In process, will continue to follow  Yancey Flemings, RN 12/22/2017, 11:05 AM

## 2017-12-22 NOTE — Evaluation (Addendum)
Occupational Therapy Evaluation Patient Details Name: Logan Cole MRN: [id number] DOB: [birth year] Today's Date: 12/22/2017    History of Present Illness Pt is a 72 y/o male admitted secondary to weakness and multiple falls. CT of head negative for acute abnormality; CT of abdomen showed hepatic cirrhosis with severe splenomegal. PMH includes liver cirrhosis, CKD3, CVA with left vision deficits, obesity, peripheral neuropathy, and DM.    Clinical Impression   This 72 yo male admitted with above presents to acute OT with decreased balance, 4-5 falls in the last 2 weeks per his report, decreased vision in left eye (double or blurry at times) all affecting his safety and independence with basic ADLs. He will benefit from acute OT with follow up HHOT.  BPs: Supine 148/65 HR 59 Sitting 139/61 HR 61 Standing 125/55 HR 67 Pt not reporting any dizziness    Follow Up Recommendations  Home health OT;Supervision/Assistance - 24 hour    Equipment Recommendations  None recommended by OT       Precautions / Restrictions Precautions Precautions: Fall;Other (comment) Precaution Comments: Watch BP  Restrictions Weight Bearing Restrictions: No      Mobility Bed Mobility Overal bed mobility: Modified Independent Bed Mobility: Supine to Sit     Supine to sit: Modified independent (Device/Increase time)        Transfers Overall transfer level: Needs assistance Equipment used: Rolling walker (2 wheeled) Transfers: Sit to/from Stand Sit to Stand: Min guard              Balance Overall balance assessment: Needs assistance Sitting-balance support: No upper extremity supported;Feet supported Sitting balance-Leahy Scale: Good     Standing balance support: Bilateral upper extremity supported;During functional activity Standing balance-Leahy Scale: Poor Standing balance comment: Reliant on BUE support on RW                           ADL either performed or assessed  with clinical judgement   ADL Overall ADL's : Needs assistance/impaired Eating/Feeding: Independent;Sitting   Grooming: Min guard;Standing   Upper Body Bathing: Set up;Sitting   Lower Body Bathing: Moderate assistance Lower Body Bathing Details (indicate cue type and reason): min guard A sit<>stand Upper Body Dressing : Set up;Sitting   Lower Body Dressing: Moderate assistance Lower Body Dressing Details (indicate cue type and reason): min guard A sit<>stand Toilet Transfer: Min guard;Ambulation;RW Toilet Transfer Details (indicate cue type and reason): bed>around to recliner Toileting- Clothing Manipulation and Hygiene: Min guard;Sit to/from stand               Vision Patient Visual Report: Diplopia;Blurring of vision Additional Comments: Pt reports intermittent diplopia and/or blurring vision from prior CVA            Pertinent Vitals/Pain Pain Assessment: 0-10 Pain Score: 7  Pain Location: back and LLE Pain Descriptors / Indicators: Aching;Sore Pain Intervention(s): Limited activity within patient's tolerance;Monitored during session;Repositioned     Hand Dominance Right   Extremity/Trunk Assessment Upper Extremity Assessment Upper Extremity Assessment: Overall WFL for tasks assessed           Communication Communication Communication: No difficulties   Cognition Arousal/Alertness: Awake/alert Behavior During Therapy: WFL for tasks assessed/performed Overall Cognitive Status: No family/caregiver present to determine baseline cognitive functioning                                 General Comments: Slowed processing noted,  and pt slow to answer questions with sometime inappropriate comments              Home Living Family/patient expects to be discharged to:: Private residence Living Arrangements: Spouse/significant other Available Help at Discharge: Family;Available PRN/intermittently Type of Home: House Home Access: Ramped entrance      Home Layout: One level     Bathroom Shower/Tub: Producer, television/film/video: Handicapped height     Home Equipment: Environmental consultant - 2 wheels;Cane - single point          Prior Functioning/Environment Level of Independence: Needs assistance  Gait / Transfers Assistance Needed: Reports he uses RW and cane for ambulation.  ADL's / Homemaking Assistance Needed: Reports he needed assist from wife for bathing/dressing--LB            OT Problem List: Impaired balance (sitting and/or standing);Impaired vision/perception;Pain      OT Treatment/Interventions: Self-care/ADL training;Balance training;DME and/or AE instruction;Visual/perceptual remediation/compensation;Patient/family education    OT Goals(Current goals can be found in the care plan section) Acute Rehab OT Goals Patient Stated Goal: to go home  OT Goal Formulation: With patient Time For Goal Achievement: 01/05/18 Potential to Achieve Goals: Good  OT Frequency: Min 2X/week              AM-PAC PT "6 Clicks" Daily Activity     Outcome Measure Help from another person eating meals?: None Help from another person taking care of personal grooming?: A Little Help from another person toileting, which includes using toliet, bedpan, or urinal?: A Little Help from another person bathing (including washing, rinsing, drying)?: A Lot Help from another person to put on and taking off regular upper body clothing?: A Little Help from another person to put on and taking off regular lower body clothing?: A Lot 6 Click Score: 17   End of Session Equipment Utilized During Treatment: Gait belt;Rolling walker Nurse Communication: Mobility status(with NT; also made her aware that pt was orthostatic by numbers but not by report)  Activity Tolerance: Patient tolerated treatment well Patient left: in chair;with call bell/phone within reach;with chair alarm set  OT Visit Diagnosis: Unsteadiness on feet (R26.81);History of falling  (Z91.81);Repeated falls (R29.6);Low vision, both eyes (H54.2)                Time: 4098-1191 OT Time Calculation (min): 29 min Charges:  OT General Charges $OT Visit: 1 Visit OT Treatments $Self Care/Home Management : 8-22 mins Ignacia Palma, OTR/L [phone] 12/22/2017

## 2017-12-22 NOTE — Progress Notes (Signed)
Physical Therapy Treatment Patient Details Name: Logan Cole MRN: [id number] DOB: [birth year] Today's Date: 12/22/2017    History of Present Illness Pt is a 72 y/o male admitted secondary to weakness and multiple falls. CT of head negative for acute abnormality; CT of abdomen showed hepatic cirrhosis with severe splenomegal. PMH includes liver cirrhosis, CKD3, CVA with left vision deficits, obesity, peripheral neuropathy, and DM.     PT Comments    Patient is progressing very well towards their physical therapy goals. Able to increase ambulation distance to 100 feet using RW and min guard assist this session. Able to ambulate with no AD in room, however, reaches for furniture for increased external support. Could benefit from trialing [facility] next session (does have one at home). Patient with orthostatic hypotension but not subjective complaint of dizziness (see below). Patient displaying decreased safety awareness with mobility. Upon exiting the room, patient attempted to get up independently to use the restroom. Patient educated extensively on calling for assistance for safety but patient did not verbalize understanding. Recommending supervision at home for all mobility and HHPT.   Orthostatics:  Supine: 148/65 Sitting: 139/61 Standing: 125/67    Follow Up Recommendations  Home health PT;Supervision for mobility/OOB     Equipment Recommendations  None recommended by PT    Recommendations for Other Services       Precautions / Restrictions Precautions Precautions: Fall;Other (comment) Precaution Comments: Watch BP  Restrictions Weight Bearing Restrictions: No    Mobility  Bed Mobility Overal bed mobility: Modified Independent Bed Mobility: Supine to Sit     Supine to sit: Modified independent (Device/Increase time)     General bed mobility comments: increased time  Transfers Overall transfer level: Needs assistance Equipment used: Rolling walker (2  wheeled);None Transfers: Sit to/from Stand Sit to Stand: Supervision         General transfer comment: Supervision for safety from bed and toilet to RW.   Ambulation/Gait Ambulation/Gait assistance: Min guard Ambulation Distance (Feet): 100 Feet Assistive device: Rolling walker (2 wheeled) Gait Pattern/deviations: Step-through pattern;Decreased stride length;Wide base of support Gait velocity: decreased   General Gait Details: Patient with decreased ability to dual task during gait. Ambulation in room with no AD; patient tending to "furniture walk" for increased support.   Stairs             Wheelchair Mobility    Modified Rankin (Stroke Patients Only)       Balance Overall balance assessment: Needs assistance Sitting-balance support: No upper extremity supported;Feet supported Sitting balance-Leahy Scale: Good     Standing balance support: No upper extremity supported;During functional activity Standing balance-Leahy Scale: Fair Standing balance comment: able to statically stand but needs external support for dynamic balance                            Cognition Arousal/Alertness: Awake/alert Behavior During Therapy: WFL for tasks assessed/performed Overall Cognitive Status: No family/caregiver present to determine baseline cognitive functioning                                 General Comments: Slowed processing      Exercises      General Comments General comments (skin integrity, edema, etc.): L visual deficits noted. With right eye closed, patient not able to distinguish colors or how many fingers PT was holding up. Stated all he saw were "bubbles." Patient states this  is chronic.       Pertinent Vitals/Pain Pain Assessment: No/denies pain Pain Score: 7  Pain Location: back and LLE Pain Descriptors / Indicators: Aching;Sore Pain Intervention(s): Limited activity within patient's tolerance;Monitored during  session;Repositioned    Home Living Family/patient expects to be discharged to:: Private residence Living Arrangements: Spouse/significant other Available Help at Discharge: Family;Available PRN/intermittently Type of Home: House Home Access: Ramped entrance   Home Layout: One level Home Equipment: Walker - 2 wheels;Cane - single point      Prior Function Level of Independence: Needs assistance  Gait / Transfers Assistance Needed: Reports he uses RW and cane for ambulation.  ADL's / Homemaking Assistance Needed: Reports he needed assist from wife for bathing/dressing--LB     PT Goals (current goals can now be found in the care plan section) Acute Rehab PT Goals Patient Stated Goal: to go home  PT Goal Formulation: With patient Time For Goal Achievement: 01/04/18 Potential to Achieve Goals: Fair Progress towards PT goals: Progressing toward goals    Frequency    Min 3X/week      PT Plan Current plan remains appropriate    Co-evaluation              AM-PAC PT "6 Clicks" Daily Activity  Outcome Measure  Difficulty turning over in bed (including adjusting bedclothes, sheets and blankets)?: A Little Difficulty moving from lying on back to sitting on the side of the bed? : A Little Difficulty sitting down on and standing up from a chair with arms (e.g., wheelchair, bedside commode, etc,.)?: A Little Help needed moving to and from a bed to chair (including a wheelchair)?: A Little Help needed walking in hospital room?: A Little Help needed climbing 3-5 steps with a railing? : A Lot 6 Click Score: 17    End of Session Equipment Utilized During Treatment: Gait belt Activity Tolerance: Patient tolerated treatment well Patient left: in chair;with call bell/phone within reach;with chair alarm set Nurse Communication: Mobility status PT Visit Diagnosis: Difficulty in walking, not elsewhere classified (R26.2);Muscle weakness (generalized) (M62.81);History of falling  (Z91.81)     Time: 7829-5621 PT Time Calculation (min) (ACUTE ONLY): 39 min  Charges:  $Gait Training: 8-22 mins $Therapeutic Activity: 23-37 mins                    G Codes:       Laurina Bustle, PT, DPT Acute Rehabilitation Services  Pager: [phone]    Vanetta Mulders 12/22/2017, 2:02 PM

## 2017-12-22 NOTE — Progress Notes (Signed)
   12/22/17 1000  Clinical Encounter Type  Visited With Patient  Visit Type Initial  Referral From Nurse  Consult/Referral To Chaplain  Spiritual Encounters  Spiritual Needs Emotional  Stress Factors  Patient Stress Factors Exhausted  Family Stress Factors None identified    Pt was laying on his bed awake and alert. Patient;s direct care nurse p[resent helping him. Chaplain and Pt had short conversation with pt sharing about his visit to some country out of the [place]. Chaplain provided emotional support through empathic and reflective encouragement and compassionate presence.  Hagar Sadiq a Water quality scientist, [employer]

## 2017-12-22 NOTE — Discharge Instructions (Signed)
It is very important that you follow up with your regular doctor, as we have changed many medications. We hope that you continue to feel better! Please remember to sit up slowly each time you are getting up to walk. Also wear compression stockings as often as possible.

## 2018-05-25 ENCOUNTER — Inpatient Hospital Stay (HOSPITAL_COMMUNITY)
Admission: EM | Admit: 2018-05-25 | Discharge: 2018-06-08 | DRG: 442 | Disposition: E | Payer: No Typology Code available for payment source | Attending: Internal Medicine | Admitting: Internal Medicine

## 2018-05-25 ENCOUNTER — Encounter (HOSPITAL_COMMUNITY): Payer: Self-pay | Admitting: Emergency Medicine

## 2018-05-25 ENCOUNTER — Emergency Department (HOSPITAL_COMMUNITY): Payer: No Typology Code available for payment source

## 2018-05-25 DIAGNOSIS — K7460 Unspecified cirrhosis of liver: Secondary | ICD-10-CM | POA: Diagnosis present

## 2018-05-25 DIAGNOSIS — Z888 Allergy status to other drugs, medicaments and biological substances status: Secondary | ICD-10-CM | POA: Diagnosis not present

## 2018-05-25 DIAGNOSIS — Z885 Allergy status to narcotic agent status: Secondary | ICD-10-CM | POA: Diagnosis not present

## 2018-05-25 DIAGNOSIS — Z794 Long term (current) use of insulin: Secondary | ICD-10-CM

## 2018-05-25 DIAGNOSIS — Z833 Family history of diabetes mellitus: Secondary | ICD-10-CM | POA: Diagnosis not present

## 2018-05-25 DIAGNOSIS — Z6841 Body Mass Index (BMI) 40.0 and over, adult: Secondary | ICD-10-CM | POA: Diagnosis not present

## 2018-05-25 DIAGNOSIS — R4182 Altered mental status, unspecified: Secondary | ICD-10-CM

## 2018-05-25 DIAGNOSIS — Z79899 Other long term (current) drug therapy: Secondary | ICD-10-CM

## 2018-05-25 DIAGNOSIS — Z7902 Long term (current) use of antithrombotics/antiplatelets: Secondary | ICD-10-CM

## 2018-05-25 DIAGNOSIS — Z66 Do not resuscitate: Secondary | ICD-10-CM | POA: Diagnosis present

## 2018-05-25 DIAGNOSIS — K7290 Hepatic failure, unspecified without coma: Secondary | ICD-10-CM | POA: Diagnosis not present

## 2018-05-25 DIAGNOSIS — N183 Chronic kidney disease, stage 3 (moderate): Secondary | ICD-10-CM | POA: Diagnosis present

## 2018-05-25 DIAGNOSIS — I5032 Chronic diastolic (congestive) heart failure: Secondary | ICD-10-CM | POA: Diagnosis present

## 2018-05-25 DIAGNOSIS — Z7189 Other specified counseling: Secondary | ICD-10-CM | POA: Diagnosis not present

## 2018-05-25 DIAGNOSIS — K7200 Acute and subacute hepatic failure without coma: Secondary | ICD-10-CM | POA: Diagnosis present

## 2018-05-25 DIAGNOSIS — Z515 Encounter for palliative care: Secondary | ICD-10-CM | POA: Diagnosis present

## 2018-05-25 DIAGNOSIS — G9340 Encephalopathy, unspecified: Secondary | ICD-10-CM

## 2018-05-25 DIAGNOSIS — E875 Hyperkalemia: Secondary | ICD-10-CM | POA: Diagnosis present

## 2018-05-25 DIAGNOSIS — K922 Gastrointestinal hemorrhage, unspecified: Secondary | ICD-10-CM | POA: Diagnosis present

## 2018-05-25 DIAGNOSIS — D62 Acute posthemorrhagic anemia: Secondary | ICD-10-CM | POA: Diagnosis present

## 2018-05-25 DIAGNOSIS — Z77098 Contact with and (suspected) exposure to other hazardous, chiefly nonmedicinal, chemicals: Secondary | ICD-10-CM | POA: Diagnosis not present

## 2018-05-25 DIAGNOSIS — E1122 Type 2 diabetes mellitus with diabetic chronic kidney disease: Secondary | ICD-10-CM | POA: Diagnosis present

## 2018-05-25 DIAGNOSIS — I130 Hypertensive heart and chronic kidney disease with heart failure and stage 1 through stage 4 chronic kidney disease, or unspecified chronic kidney disease: Secondary | ICD-10-CM | POA: Diagnosis present

## 2018-05-25 DIAGNOSIS — Z8249 Family history of ischemic heart disease and other diseases of the circulatory system: Secondary | ICD-10-CM | POA: Diagnosis not present

## 2018-05-25 DIAGNOSIS — D649 Anemia, unspecified: Secondary | ICD-10-CM

## 2018-05-25 DIAGNOSIS — K7682 Hepatic encephalopathy: Secondary | ICD-10-CM | POA: Diagnosis present

## 2018-05-25 DIAGNOSIS — Z9114 Patient's other noncompliance with medication regimen: Secondary | ICD-10-CM | POA: Diagnosis not present

## 2018-05-25 LAB — COMPREHENSIVE METABOLIC PANEL
ALBUMIN: 2.3 g/dL — AB (ref 3.5–5.0)
ALT: 17 U/L (ref 0–44)
AST: 58 U/L — AB (ref 15–41)
Alkaline Phosphatase: 60 U/L (ref 38–126)
Anion gap: 11 (ref 5–15)
BILIRUBIN TOTAL: 1.4 mg/dL — AB (ref 0.3–1.2)
BUN: 40 mg/dL — AB (ref 8–23)
CO2: 19 mmol/L — AB (ref 22–32)
Calcium: 9.1 mg/dL (ref 8.9–10.3)
Chloride: 107 mmol/L (ref 98–111)
Creatinine, Ser: 2.55 mg/dL — ABNORMAL HIGH (ref 0.61–1.24)
GFR calc Af Amer: 27 mL/min — ABNORMAL LOW (ref >60)
GFR calc non Af Amer: 24 mL/min — ABNORMAL LOW (ref >60)
GLUCOSE: 186 mg/dL — AB (ref 70–99)
Potassium: 5.3 mmol/L — ABNORMAL HIGH (ref 3.5–5.1)
SODIUM: 137 mmol/L (ref 135–145)
Total Protein: 6.8 g/dL (ref 6.5–8.1)

## 2018-05-25 LAB — CBC WITH DIFFERENTIAL/PLATELET
ABS IMMATURE GRANULOCYTES: 0.04 10*3/uL (ref 0.00–0.07)
Basophils Absolute: 0 10*3/uL (ref 0.0–0.1)
Basophils Relative: 0 %
EOS PCT: 0 %
Eosinophils Absolute: 0 10*3/uL (ref 0.0–0.5)
HCT: 19.5 % — ABNORMAL LOW (ref 39.0–52.0)
HEMOGLOBIN: 5.5 g/dL — AB (ref 13.0–17.0)
IMMATURE GRANULOCYTES: 0 %
LYMPHS ABS: 0.8 10*3/uL (ref 0.7–4.0)
Lymphocytes Relative: 9 %
MCH: 24.7 pg — ABNORMAL LOW (ref 26.0–34.0)
MCHC: 28.2 g/dL — ABNORMAL LOW (ref 30.0–36.0)
MCV: 87.4 fL (ref 80.0–100.0)
MONOS PCT: 11 %
Monocytes Absolute: 1 10*3/uL (ref 0.1–1.0)
Neutro Abs: 7.3 10*3/uL (ref 1.7–7.7)
Neutrophils Relative %: 80 %
Platelets: 161 10*3/uL (ref 150–400)
RBC: 2.23 MIL/uL — AB (ref 4.22–5.81)
RDW: 14.4 % (ref 11.5–15.5)
WBC: 9.1 10*3/uL (ref 4.0–10.5)
nRBC: 0 % (ref 0.0–0.2)

## 2018-05-25 LAB — CBG MONITORING, ED: Glucose-Capillary: 170 mg/dL — ABNORMAL HIGH (ref 70–99)

## 2018-05-25 LAB — AMMONIA: Ammonia: 124 umol/L — ABNORMAL HIGH (ref 9–35)

## 2018-05-25 MED ORDER — LORAZEPAM 2 MG/ML IJ SOLN
1.0000 mg | Freq: Once | INTRAMUSCULAR | Status: AC
  Administered 2018-05-25: 1 mg via INTRAVENOUS
  Filled 2018-05-25: qty 1

## 2018-05-25 MED ORDER — LORAZEPAM 2 MG/ML IJ SOLN
1.0000 mg | Freq: Once | INTRAMUSCULAR | Status: AC
Start: 2018-05-25 — End: 2018-05-25
  Administered 2018-05-25: 1 mg via INTRAVENOUS
  Filled 2018-05-25: qty 1

## 2018-05-25 MED ORDER — FUROSEMIDE 10 MG/ML IJ SOLN
20.0000 mg | Freq: Once | INTRAMUSCULAR | Status: AC
  Administered 2018-05-25: 20 mg via INTRAVENOUS
  Filled 2018-05-25: qty 2

## 2018-05-25 MED ORDER — GLYCOPYRROLATE 0.2 MG/ML IJ SOLN
0.4000 mg | Freq: Four times a day (QID) | INTRAMUSCULAR | Status: DC | PRN
  Administered 2018-05-26 – 2018-05-27 (×2): 0.4 mg via INTRAVENOUS
  Filled 2018-05-25 (×3): qty 2

## 2018-05-25 MED ORDER — SODIUM CHLORIDE 0.9% IV SOLUTION: Freq: Once | INTRAVENOUS | Status: DC

## 2018-05-25 MED ORDER — IPRATROPIUM-ALBUTEROL 0.5-2.5 (3) MG/3ML IN SOLN
3.0000 mL | Freq: Once | RESPIRATORY_TRACT | Status: AC
  Administered 2018-05-25: 3 mL via RESPIRATORY_TRACT
  Filled 2018-05-25: qty 3

## 2018-05-25 MED ORDER — LACTULOSE ENEMA
300.0000 mL | Freq: Two times a day (BID) | ORAL | Status: DC
  Administered 2018-05-25 – 2018-05-26 (×2): 300 mL via RECTAL
  Filled 2018-05-25 (×3): qty 300

## 2018-05-25 MED ORDER — SODIUM CHLORIDE 0.9 % IV SOLN
1.0000 mg/h | INTRAVENOUS | Status: DC
  Administered 2018-05-25: 0.5 mg/h via INTRAVENOUS
  Administered 2018-05-27: 1 mg/h via INTRAVENOUS
  Filled 2018-05-25 (×3): qty 2.5

## 2018-05-25 NOTE — ED Notes (Signed)
ED Provider at bedside. 

## 2018-05-25 NOTE — Consult Note (Signed)
NAME:  Logan Cole, MRN:  [id number], DOB:  1946-03-31, LOS: 0 ADMISSION DATE:  05/13/2018, CONSULTATION DATE:  10/18 REFERRING MD:  Dr. Rubin Payor EDP, CHIEF COMPLAINT:  AMS   Brief History   72 year old male with liver disease, CKD, and heart failure and poor functional status. Presented with AMS 10/18 and found to have hgb 5.5 and ammonia 127. He was encephalopathic and not protecting airway. PCCM asked to see.   Past Medical History  Agent orange exposure, hepatic disease, CKD, CHF.   Significant Hospital Events   10/18 admit  Consults: date of consult/date signed off & final recs:    Procedures (surgical and bedside):    Significant Diagnostic Tests:    Micro Data:    Antimicrobials:     Subjective:  Unable to give history  Objective   Blood pressure 118/72, pulse (!) 113, resp. rate (!) 22, SpO2 100 %.       No intake or output data in the 24 hours ending 05/16/2018 1709 There were no vitals filed for this visit.  Examination: General: morbidly obese male in acute distress HENT: /AT, unable to appreciate JVD Lungs: Unable to appreciate breath sounds over thrashing and moaning. Appears to be in respiratory distress.  Cardiovascular: Tachy, regular.  Abdomen: Distended, soft, protuberant.  Extremities: moving all extremites. Edematous Neuro: Encephalopathic. Does not respond. Thrashing in bed and moaning.   Resolved Hospital Problem list     Assessment & Plan:  Hepatic encephalopathy: patient has a history of hepatic failure and has had issues with ammonia in the past. Refused lactulose due to poor taste and hasn't taken for about one month. In order to treat aggressively we would need to take to ICU and intubate for airway protection and OG placement for medication administration.  Gastrointestinal bleeding: Hemoglobin 5.5. Wife reports dark, loose stools over the past few days.   Goals of Care: Family reports overall functional quality is very poor. He  has been in poor health for at least 16 years and has been slowly declining. At his best he can only walk to bathroom before becoming extremely winded. Spends most of his time in living room chair. Has stopped taking some medications and has even stated "let me die". I informed family that in order to treat him aggressively we would need to admit to ICU and place on life support. It may be temporary, but after ICU stay with mechanical ventilation, even in best case scenario he would be left weaker than he was prior to this acute illness. With this in mind, he would not want to be on life support. They have elected to pursue comfort measures. This was discussed with wife, son, and also daughter over video call.   Plan: Admit to hospitalist Will start dilaudid infusion for comfort (low risk morphine allergy) Consult chaplain   Disposition / Summary of Today's Plan 05/30/2018   To Medical floor for comfort care.      Diet: NPO Pain/Anxiety/Delirium protocol (if indicated):  VAP protocol (if indicated):  DVT prophylaxis:  GI prophylaxis:  Hyperglycemia protocol:  Mobility:  Code Status: DNR Family Communication: Wife, son, daughter updated. Daughter flying in from [place].   Labs   CBC: Recent Labs  Lab 05/31/2018 1516  WBC 9.1  NEUTROABS 7.3  HGB 5.5*  HCT 19.5*  MCV 87.4  PLT 161    Basic Metabolic Panel: Recent Labs  Lab 05/22/2018 1516  NA 137  K 5.3*  CL 107  CO2 19*  GLUCOSE 186*  BUN 40*  CREATININE 2.55*  CALCIUM 9.1   GFR: CrCl cannot be calculated (Unknown ideal weight.). Recent Labs  Lab 05/11/2018 1516  WBC 9.1    Liver Function Tests: Recent Labs  Lab 06/03/2018 1516  AST 58*  ALT 17  ALKPHOS 60  BILITOT 1.4*  PROT 6.8  ALBUMIN 2.3*   No results for input(s): LIPASE, AMYLASE in the last 168 hours. Recent Labs  Lab 05/08/2018 1516  AMMONIA 124*    ABG No results found for: PHART, PCO2ART, PO2ART, HCO3, TCO2, ACIDBASEDEF, O2SAT   Coagulation  Profile: No results for input(s): INR, PROTIME in the last 168 hours.  Cardiac Enzymes: No results for input(s): CKTOTAL, CKMB, CKMBINDEX, TROPONINI in the last 168 hours.  HbA1C: Hgb A1c MFr Bld  Date/Time Value Ref Range Status  12/21/2017 02:51 AM 7.0 (H) 4.8 - 5.6 % Final    Comment:    (NOTE) Pre diabetes:          5.7%-6.4% Diabetes:              >6.4% Glycemic control for   <7.0% adults with diabetes   09/27/2017 07:38 AM 6.4 (H) 4.8 - 5.6 % Final    Comment:    (NOTE) Pre diabetes:          5.7%-6.4% Diabetes:              >6.4% Glycemic control for   <7.0% adults with diabetes     CBG: Recent Labs  Lab 05/09/2018 1531  GLUCAP 170*    Admitting History of Present Illness.   72 year old male with PMH as below, which is significant for Agent Orange Exposure, CHF, hepatic disease, CKD, DM, morbid obesity and poor functional status. He was recently admitted to [facility] for CHF exacerbation and hepatic encephalopathy. He was discharged on lactulose, but refused to take this due to flavor. At baseline he is very debilitated and is only able to ambulate about 20 feet before needing to rest. His wife noticed a decline 10/16. He had no specific complaints, but he had been more drowsy and dark stools. He presented to [facility] 10/18 for these complaints and was found to have hemoglobin 5.5 and profound encephalopathy. PCCM asked to evaluate.   Review of Systems:   unable  Past Medical History  He,  has a past medical history of Aortic atherosclerosis (HCC) (09/30/2017), CHF (congestive heart failure) (HCC), CKD (chronic kidney disease), Diabetes mellitus without complication (HCC), Liver dysfunction, Morbid obesity with BMI of 50.0-59.9, adult (HCC) (09/29/2017), Renal cyst, right, Renal disorder, Retinal vein occlusion of left eye, and Thrombocytopenia (HCC).   Surgical History    Past Surgical History:  Procedure Laterality Date  . APPENDECTOMY    . CHOLECYSTECTOMY         Social History   Social History   Socioeconomic History  . Marital status: Married    Spouse name: Not on file  . Number of children: Not on file  . Years of education: Not on file  . Highest education level: Not on file  Occupational History  . Not on file  Social Needs  . Financial resource strain: Not on file  . Food insecurity:    Worry: Not on file    Inability: Not on file  . Transportation needs:    Medical: Not on file    Non-medical: Not on file  Tobacco Use  . Smoking status: Never Smoker  . Smokeless tobacco: Never  Used  Substance and Sexual Activity  . Alcohol use: No    Frequency: Never  . Drug use: No  . Sexual activity: Not on file  Lifestyle  . Physical activity:    Days per week: Not on file    Minutes per session: Not on file  . Stress: Not on file  Relationships  . Social connections:    Talks on phone: Not on file    Gets together: Not on file    Attends religious service: Not on file    Active member of club or organization: Not on file    Attends meetings of clubs or organizations: Not on file    Relationship status: Not on file  . Intimate partner violence:    Fear of current or ex partner: Not on file    Emotionally abused: Not on file    Physically abused: Not on file    Forced sexual activity: Not on file  Other Topics Concern  . Not on file  Social History Narrative  . Not on file  ,  reports that he has never smoked. He has never used smokeless tobacco. He reports that he does not drink alcohol or use drugs.   Family History   His family history includes Diabetes in his father; Hypertension in his mother.   Allergies Allergies  Allergen Reactions  . Lescol [Fluvastatin Sodium] Other (See Comments)    unspecified  . Other Other (See Comments)    Non-steroidal anti-inflammatory  . Vardenafil Other (See Comments)    unspecified  . Morphine And Related Rash     Home Medications  Prior to Admission medications    Medication Sig Start Date End Date Taking? Authorizing Provider  acetaminophen (TYLENOL) 500 MG tablet Take 500 mg by mouth every 4 (four) hours as needed for mild pain.   Yes [provider]  ammonium lactate (LAC-HYDRIN) 12 % lotion Apply 1 application topically daily as needed for dry skin.   Yes [provider]  clopidogrel (PLAVIX) 75 MG tablet Take 75 mg by mouth daily.   Yes [provider]  clotrimazole (LOTRIMIN) 1 % cream Apply 1 application topically daily. To feet   Yes [provider]  ferrous sulfate 325 (65 FE) MG tablet Take 325 mg by mouth See admin instructions. Take 1 tablet by mouth Monday, Wednesday and Friday for chronic anemia.   Yes [provider]  folic acid (FOLVITE) 1 MG tablet Take 1 mg by mouth daily.   Yes [provider]  furosemide (LASIX) 20 MG tablet Take 20 mg by mouth daily.   Yes [provider]  glucose 4 GM chewable tablet Chew 4 tablets by mouth See admin instructions. Chew 4 tablets by mouth every 15 minutes as needed (repeat every 15 minutes if blood sugar less than 70)   Yes [provider]  Insulin Glargine (LANTUS SOLOSTAR) 100 UNIT/ML Solostar Pen Inject 50 Units into the skin 2 (two) times daily.   Yes [provider]  ipratropium-albuterol (DUONEB) 0.5-2.5 (3) MG/3ML SOLN Take 3 mLs by nebulization every 6 (six) hours as needed (SOB).   Yes [provider]  lactulose (CHRONULAC) 10 GM/15ML solution Take 10 g by mouth 2 (two) times daily.   Yes [provider]  metoprolol tartrate (LOPRESSOR) 25 MG tablet Take 12.5 mg by mouth 2 (two) times daily.   Yes [provider]  NON FORMULARY Apply 1 application topically daily as needed ("Lanolin/Mineral Oil" unscented - as  directed).   Yes [provider]  Oxycodone HCl 10 MG TABS Take 10 mg by mouth every 12 (twelve) hours as needed (pain).   Yes [provider]  pantoprazole  (PROTONIX) 40 MG tablet Take 40 mg by mouth daily.    Yes [provider]  QUEtiapine (SEROQUEL) 300 MG tablet Take 300 mg by mouth at bedtime.   Yes [provider]  rifaximin (XIFAXAN) 550 MG TABS tablet Take 550 mg by mouth 2 (two) times daily.    Yes [provider]  sertraline (ZOLOFT) 100 MG tablet Take 1 tablet (100 mg total) by mouth daily. 12/23/17  Yes Shirley, [place], DO  Skin Protectants, Misc. (EUCERIN) cream Apply 1 application topically daily. To arms and legs   Yes [provider]  spironolactone (ALDACTONE) 100 MG tablet Take 100 mg by mouth daily.   Yes [provider]  oxyCODONE (OXY IR/ROXICODONE) 5 MG immediate release tablet Take 1 tablet (5 mg total) by mouth every 4 (four) hours as needed for severe pain. Patient not taking: Reported on 06-Jun-2018 12/22/17   Shirley, [place], DO  polyethylene glycol [facility] / GLYCOLAX) packet Take 17 g by mouth daily as needed for mild constipation. Patient not taking: Reported on 06-06-18 12/22/17   Shirley, [place], DO  zinc oxide 20 % ointment Apply 1 application topically 2 (two) times daily as needed for irritation.    [provider]     Critical care time:     My critical care time excluding procedures: 45 minutes   Joneen Roach, AGACNP-BC [facility] Pulmonary/Critical Care Pager [phone] or [phone]  June 06, 2018 5:28 PM

## 2018-05-25 NOTE — ED Triage Notes (Addendum)
Patient arrived via [facility], family reports patient has not been himself since this morning. Patient alert, does not follow command EDP at bedisde

## 2018-05-25 NOTE — Progress Notes (Signed)
25 mg (2.5 mL) IV hydromorphone wasted in Main pharmacy's sharps container. This amount was left over after making the Dilaudid IV. Ewing Schlein, PharmD, was witness.

## 2018-05-25 NOTE — ED Notes (Signed)
Critical care at bedside  

## 2018-05-25 NOTE — ED Provider Notes (Signed)
[facility] EMERGENCY DEPARTMENT Provider Note   CSN: [id number] Arrival date & time: 06/14/2018  1504     History   Chief Complaint No chief complaint on file.   HPI Logan Cole is a 72 y.o. male.  The history is provided by the patient. No language interpreter was used.  Altered Mental Status   This is a new problem. The current episode started yesterday. The problem has been gradually worsening. Associated symptoms include confusion and agitation. Risk factors: history of liver failure  His past medical history is significant for diabetes, liver disease, hypertension and heart disease.   Pt's wife reports pt has not been taking his lactolose.  She reports it makes him feel bad.  Pt became confused and has had limited responsiveness today  Past Medical History:  Diagnosis Date  . Aortic atherosclerosis (HCC) 09/30/2017  . CHF (congestive heart failure) (HCC)   . CKD (chronic kidney disease)   . Diabetes mellitus without complication (HCC)   . Liver dysfunction   . Morbid obesity with BMI of 50.0-59.9, adult (HCC) 09/29/2017  . Renal cyst, right   . Renal disorder   . Retinal vein occlusion of left eye   . Thrombocytopenia [facility])     Patient Active Problem List   Diagnosis Date Noted  . Fall   . Renal insufficiency   . Syncope 12/20/2017  . Weakness of both lower extremities 12/20/2017  . Anasarca 09/30/2017  . Aortic atherosclerosis (HCC) 09/30/2017  . Acute on chronic diastolic CHF (congestive heart failure) (HCC) 09/29/2017  . Morbid obesity with BMI of 50.0-59.9, adult (HCC) 09/29/2017  . Liver cirrhosis (HCC) 09/27/2017  . Hypoalbuminemia 09/27/2017  . Liver dysfunction   . Diabetes mellitus with renal complications (HCC)   . CKD (chronic kidney disease), stage III (HCC)   . Thrombocytopenia (HCC)   . Renal cyst, right     Past Surgical History:  Procedure Laterality Date  . APPENDECTOMY    . CHOLECYSTECTOMY          Home  Medications    Prior to Admission medications   Medication Sig Start Date End Date Taking? Authorizing Provider  ammonium lactate (LAC-HYDRIN) 12 % lotion Apply 1 application topically daily as needed for dry skin.    [provider]  ARTIFICIAL SALIVA MT Use as directed 4 sprays in the mouth or throat daily as needed.    [provider]  clopidogrel (PLAVIX) 75 MG tablet Take 75 mg by mouth daily.    [provider]  clotrimazole (LOTRIMIN) 1 % cream Apply 1 application topically daily. To feet    [provider]  ferrous sulfate 325 (65 FE) MG tablet Take 325 mg by mouth daily.    [provider]  folic acid (FOLVITE) 1 MG tablet Take 1 mg by mouth daily.    [provider]  insulin aspart (NOVOLOG FLEXPEN) 100 UNIT/ML FlexPen Inject 25 Units into the skin 3 (three) times daily with meals.     [provider]  Insulin Glargine (LANTUS SOLOSTAR) 100 UNIT/ML Solostar Pen Inject 50 Units into the skin 2 (two) times daily.    [provider]  ipratropium-albuterol (DUONEB) 0.5-2.5 (3) MG/3ML SOLN Take 3 mLs by nebulization every 6 (six) hours as needed (SOB).    [provider]  metoprolol tartrate (LOPRESSOR) 25 MG tablet Take 12.5 mg by mouth 2 (two) times daily.    [provider]  NON FORMULARY Apply 1 application topically daily  as needed ("Lanolin/Mineral Oil" unscented - as directed).    [provider]  oxyCODONE (OXY IR/ROXICODONE) 5 MG immediate release tablet Take 1 tablet (5 mg total) by mouth every 4 (four) hours as needed for severe pain. 12/22/17   Shirley, [place], DO  pantoprazole (PROTONIX) 40 MG tablet Take 40 mg by mouth daily.     [provider]  polyethylene glycol (MIRALAX / GLYCOLAX) packet Take 17 g by mouth daily as needed for mild constipation. 12/22/17   Shirley, [place], DO  rifaximin (XIFAXAN) 550 MG TABS tablet Take 550 mg by mouth 3 (three) times daily.    [provider]  sertraline (ZOLOFT) 100 MG tablet Take 1 tablet (100 mg total) by mouth daily. 12/23/17   Shirley, [place], DO  Skin Protectants, Misc. (EUCERIN) cream Apply 1 application topically daily. To arms and legs    [provider]  zinc oxide 20 % ointment Apply 1 application topically 2 (two) times daily as needed for irritation.    [provider]    Family History Family History  Problem Relation Age of Onset  . Hypertension Mother   . Diabetes Father     Social History Social History   Tobacco Use  . Smoking status: Never Smoker  . Smokeless tobacco: Never Used  Substance Use Topics  . Alcohol use: No    Frequency: Never  . Drug use: No     Allergies   Lescol [fluvastatin sodium]; Other; Vardenafil; and Morphine and related   Review of Systems Review of Systems  Respiratory: Positive for shortness of breath and wheezing.   Psychiatric/Behavioral: Positive for agitation and confusion.  All other systems reviewed and are negative.    Physical Exam Updated Vital Signs BP (!) 101/46   Pulse (!) 114   Resp (!) 22   SpO2 100%   Physical Exam  Constitutional: He appears well-developed and well-nourished.  HENT:  Head: Normocephalic and atraumatic.  Gag positive  Eyes: Conjunctivae are normal.  Neck: Neck supple.  Cardiovascular: Normal rate and regular rhythm.  No murmur heard. Pulmonary/Chest: Effort normal. No respiratory distress. He has wheezes.  Abdominal: Soft. He exhibits distension. There is no tenderness.  Musculoskeletal: He exhibits edema.  Neurological: He is alert.  Skin: Skin is warm and dry.  Psychiatric:  Confused combative   Nursing note and vitals reviewed.    ED Treatments / Results  Labs (all labs ordered are listed, but only abnormal results are displayed) Labs Reviewed  CBG MONITORING, ED - Abnormal; Notable for the following components:      Result Value   Glucose-Capillary 170 (*)    All other  components within normal limits  CBC WITH DIFFERENTIAL/PLATELET  COMPREHENSIVE METABOLIC PANEL  AMMONIA  URINALYSIS, ROUTINE W REFLEX MICROSCOPIC    EKG None  Radiology No results found.  Procedures .Critical Care Performed by: Elson Areas, PA-C Authorized by: Elson Areas, PA-C   Critical care provider statement:    Critical care time (minutes):  60   Critical care start time:  06/04/2018 3:00 PM   Critical care end time:  05/22/2018 4:04 PM   Critical care was necessary to treat or prevent imminent or life-threatening deterioration of the following conditions:  CNS failure or compromise and hepatic failure   Critical care was time spent personally by me on the following activities:  Blood draw for specimens, development of treatment plan with patient or surrogate, discussions with consultants, examination of patient, interpretation of cardiac  output measurements, obtaining history from patient or surrogate, ordering and performing treatments and interventions, ordering and review of laboratory studies, ordering and review of radiographic studies, pulse oximetry and re-evaluation of patient's condition   I assumed direction of critical care for this patient from another provider in my specialty: no     (including critical care time)  Medications Ordered in ED Medications  ipratropium-albuterol (DUONEB) 0.5-2.5 (3) MG/3ML nebulizer solution 3 mL (3 mLs Nebulization Given 05/13/2018 1529)  LORazepam (ATIVAN) injection 1 mg (1 mg Intravenous Given 06/05/2018 1529)     Initial Impression / Assessment and Plan / ED Course  I have reviewed the triage vital signs and the nursing notes.  Pertinent labs & imaging results that were available during my care of the patient were reviewed by me and considered in my medical decision making (see chart for details).  Clinical Course as of May 26 1547  Fri May 25, 2018  1547 CBC with Differential/Platelet [LS]    Clinical Course User  Index [LS] Elson Areas, PA-C    MDM  Pt thrashing, hitting,  Pt given ativan 1mg . Pt given duoneb.   Pt had some improvement in breathing  CBG obtained.  Lab called hemoglobin is 5.5.  Dr. Rubin Payor in to see and examine.  Type and cross x 2 units ordered.    Wife reports pt would want intubation if for a short period of time  I spoke to critical care who will see him here for evaluation    Pt seen by critical care.  Family decided they do not want pt intubated.  Family request comfort measures.  Internal medicine Teaching service on call for unassigned will see pt for admission.  Final Clinical Impressions(s) / ED Diagnoses   Final diagnoses:  Altered mental status, unspecified altered mental status type  Anemia, unspecified type  Acute liver failure without hepatic coma    ED Discharge Orders    None        Osie Cheeks 05/24/2018 1916    Benjiman Core, MD 05/26/18 [phone]

## 2018-05-25 NOTE — H&P (Addendum)
Date: 05/27/2018               Patient Name:  Logan Cole MRN: [id number]  DOB: 08-10-1945 Age / Sex: 72 y.o., male   PCP: Borum, Vista Mink, MD         Medical Service: Internal Medicine Teaching Service         Attending Physician: Dr. Earl Lagos, MD    First Contact: Dr. Karilyn Cota Pager: [phone]  Second Contact: Dr. Mikey Bussing Pager: [phone]       After Hours (After 5p/  First Contact Pager: [phone]  weekends / holidays): Second Contact Pager: [phone]   Chief Complaint: AMS  History of Present Illness: Logan Cole is a 72 year old male with past medical history significant for liver cirrhosis, agent orange exposure, chronic diastolic congestive heart failure, and CKD who presented with altered mental status. Patient was altered and agitated during interview; history taking from wife, sister and son. Family reported last night he started vomiting and this progressed through the night. He was last seen alert and oriented early this morning or late last night. Wife thought he was just sick from the vomiting but now presumes he may have been somewhat altered last night. This morning he was much more altered so they called EMS. He was very combative and agitated when the EMS arrived. Family also reported he had dark stools for the past few days but no blood in his vomit. He does have a history of GI bleeds. He stopped taking his lactulose ~1 month ago because he felt sick on it and felt "what's the point." Family reported he was taking his other medications. At baseline, he lays on his recliner and can walk to the bathroom and back. He has stopped taking his lactulose in the past and been admitted but he has never been this agitated per the family. Sister stated patient has been admitted ~30 times due to hepatic encephalopathy.   In the ED, he was found to be tachypneic, tachycardic, hypertensive.  CBC showed a hemoglobin of 5.5.  Potassium 5.3.  BUN 40 creatinine 2.55.  Ammonia 124.   Chest x-ray showed stable cardiomegaly with mild bilateral edema/infiltrates and congestion.  Became agitated in the ED and was given 1 dose of Ativan 1 mg and DuoNeb nebulization. Transfusion was ordered but could not be delivered due to patient's agitation. PCCM was consulted due to concerns for patient not being to protect airway. Family was informed that recovering from intubation/ventilator would be difficult for the patient and possibly make him weaker. Family did not want patient to be intubated and requested comfort measures.  When we went to see the patient and family, they expressed that he would not want to be on a ventilator or intubated; however, they want him to be comfortable and attempt other measures that could help. They agreed to trying lasix and a lactulose enema. They did not want to transfuse blood at this time due to his volume status. They understand patient's clinical picture is tenuous at this time and may deteriorate over night. Family requested inpatient hospice services. We said we were available for any questions or concerns.   Discussed case with palliative care, they gave recommendations and will see patient in the morning.   Meds:  Current Meds  Medication Sig  . acetaminophen (TYLENOL) 500 MG tablet Take 500 mg by mouth every 4 (four) hours as needed for mild pain.  Marland Kitchen ammonium lactate (LAC-HYDRIN) 12 % lotion Apply 1 application  topically daily as needed for dry skin.  Marland Kitchen clopidogrel (PLAVIX) 75 MG tablet Take 75 mg by mouth daily.  . clotrimazole (LOTRIMIN) 1 % cream Apply 1 application topically daily. To feet  . ferrous sulfate 325 (65 FE) MG tablet Take 325 mg by mouth See admin instructions. Take 1 tablet by mouth Monday, Wednesday and Friday for chronic anemia.  . folic acid (FOLVITE) 1 MG tablet Take 1 mg by mouth daily.  . furosemide (LASIX) 20 MG tablet Take 20 mg by mouth daily.  Marland Kitchen glucose 4 GM chewable tablet Chew 4 tablets by mouth See admin instructions.  Chew 4 tablets by mouth every 15 minutes as needed (repeat every 15 minutes if blood sugar less than 70)  . Insulin Glargine (LANTUS SOLOSTAR) 100 UNIT/ML Solostar Pen Inject 50 Units into the skin 2 (two) times daily.  Marland Kitchen ipratropium-albuterol (DUONEB) 0.5-2.5 (3) MG/3ML SOLN Take 3 mLs by nebulization every 6 (six) hours as needed (SOB).  Marland Kitchen lactulose (CHRONULAC) 10 GM/15ML solution Take 10 g by mouth 2 (two) times daily.  . metoprolol tartrate (LOPRESSOR) 25 MG tablet Take 12.5 mg by mouth 2 (two) times daily.  . NON FORMULARY Apply 1 application topically daily as needed ("Lanolin/Mineral Oil" unscented - as directed).  . Oxycodone HCl 10 MG TABS Take 10 mg by mouth every 12 (twelve) hours as needed (pain).  . pantoprazole (PROTONIX) 40 MG tablet Take 40 mg by mouth daily.   . QUEtiapine (SEROQUEL) 300 MG tablet Take 300 mg by mouth at bedtime.  . rifaximin (XIFAXAN) 550 MG TABS tablet Take 550 mg by mouth 2 (two) times daily.   . sertraline (ZOLOFT) 100 MG tablet Take 1 tablet (100 mg total) by mouth daily.  . Skin Protectants, Misc. (EUCERIN) cream Apply 1 application topically daily. To arms and legs  . spironolactone (ALDACTONE) 100 MG tablet Take 100 mg by mouth daily.     Allergies: Allergies as of 05/09/2018 - Review Complete 06/02/2018  Allergen Reaction Noted  . Lescol [fluvastatin sodium] Other (See Comments) 12/20/2017  . Other Other (See Comments) 12/20/2017  . Vardenafil Other (See Comments) 12/20/2017  . Morphine and related Rash 09/26/2017   Past Medical History:  Diagnosis Date  . Aortic atherosclerosis (HCC) 09/30/2017  . CHF (congestive heart failure) (HCC)   . CKD (chronic kidney disease)   . Diabetes mellitus without complication (HCC)   . Liver dysfunction   . Morbid obesity with BMI of 50.0-59.9, adult (HCC) 09/29/2017  . Renal cyst, right   . Renal disorder   . Retinal vein occlusion of left eye   . Thrombocytopenia (HCC)     Family History:  Family  History  Problem Relation Age of Onset  . Hypertension Mother   . Diabetes Father     Social History:  Social History   Socioeconomic History  . Marital status: Married    Spouse name: Not on file  . Number of children: Not on file  . Years of education: Not on file  . Highest education level: Not on file  Occupational History  . Not on file  Social Needs  . Financial resource strain: Not on file  . Food insecurity:    Worry: Not on file    Inability: Not on file  . Transportation needs:    Medical: Not on file    Non-medical: Not on file  Tobacco Use  . Smoking status: Never Smoker  . Smokeless tobacco: Never Used  Substance and Sexual Activity  .  Alcohol use: No    Frequency: Never  . Drug use: No  . Sexual activity: Not on file  Lifestyle  . Physical activity:    Days per week: Not on file    Minutes per session: Not on file  . Stress: Not on file  Relationships  . Social connections:    Talks on phone: Not on file    Gets together: Not on file    Attends religious service: Not on file    Active member of club or organization: Not on file    Attends meetings of clubs or organizations: Not on file    Relationship status: Not on file  . Intimate partner violence:    Fear of current or ex partner: Not on file    Emotionally abused: Not on file    Physically abused: Not on file    Forced sexual activity: Not on file  Other Topics Concern  . Not on file  Social History Narrative  . Not on file   Review of Systems: A complete ROS was not able to be completed because the patient was altered and agitated.  Physical Exam: Blood pressure (!) 144/86, pulse (!) 105, resp. rate (!) 24, SpO2 100 %.  Physical Exam  Cardiovascular:  tachycardic  Abdominal: Bowel sounds are normal. He exhibits distension.  Hepatomegaly  Musculoskeletal: He exhibits edema.  2+ pitting edema bilaterally  Neurological: He is agitated and disoriented.  Skin: Skin is warm and dry.    Extensive bruising on UE, abdomen   -limited physical exam due to patient's mental status and agitation  EKG: personally reviewed my interpretation is sinus tachycardia, irregular rate, prolonged PR interval  CXR: personally reviewed my interpretation is cardiomegaly, low lung volumes, bilateral edema.   Assessment & Plan by Problem: Active Problems:   Hepatic encephalopathy [facility])  Logan Cole is a 72 year old male with past medical history significant for liver cirrhosis, agent orange exposure, chronic diastolic congestive heart failure, and CKD who presented with altered mental status.  Hepatic Encephalopathy  - Pt has a hx of liver cirrhosis with numerous past admissions due to hepatic encephalopathy. He stopped taking lactulose about one month ago because it made him sick; was taking rifaxmin per family. In the ED, ammonia was 124. K 5.3. He was agitated so one dose of ativan 1 mg was given.  - PCCM was consulted to protect airway and informed family his recovery post intubation would be difficult; family expressed he would not want to be on a ventilator or intubated however they would want to try other measures if possible - PCCM to start dilaudid infusion for comfort  - chaplain and palliative care consulted - lactulose enema 200 gm - glycopyrrolate 0.4mg   Anemia - Hgb 5.5 on admission - Wife reported dark loose stools for past few days, hx of GI bleeds, no blood in vomit - transfusion was ordered; however, given patient's agitated state at that time it was not possible to transfuse - family does not want transfusion at this time due to patient's volume status  Hyperkalemia - K 5.3 on admission  CHF -Last echo from May 2019 showed an LVEF of 60 to 65%.  No regional wall or valvular abnormalities. -Home medications include metoprolol 25 mg, spironolactone 100 mg, furosemide 20 mg  CKD - Cr on admission 2.55, unclear baseline, prior to 5/19 ~1.3, since 5/19 has ranged from  2-3  DM -Last hemoglobin A1c in May 2019 was 7.0 - home medications Lantus 50  units bid   Diet: NPO DVT prophylaxis: SCDs Code: DNR  Dispo: Admit patient to Inpatient with expected length of stay greater than 2 midnights.  SignedJaci Standard, DO 05/09/2018, 7:12 PM  Pager: [phone]

## 2018-05-26 DIAGNOSIS — Z9114 Patient's other noncompliance with medication regimen: Secondary | ICD-10-CM

## 2018-05-26 DIAGNOSIS — K922 Gastrointestinal hemorrhage, unspecified: Secondary | ICD-10-CM

## 2018-05-26 DIAGNOSIS — Z515 Encounter for palliative care: Secondary | ICD-10-CM

## 2018-05-26 DIAGNOSIS — R4182 Altered mental status, unspecified: Secondary | ICD-10-CM

## 2018-05-26 DIAGNOSIS — I5032 Chronic diastolic (congestive) heart failure: Secondary | ICD-10-CM

## 2018-05-26 DIAGNOSIS — D62 Acute posthemorrhagic anemia: Secondary | ICD-10-CM

## 2018-05-26 DIAGNOSIS — K7290 Hepatic failure, unspecified without coma: Secondary | ICD-10-CM

## 2018-05-26 DIAGNOSIS — K7460 Unspecified cirrhosis of liver: Secondary | ICD-10-CM

## 2018-05-26 DIAGNOSIS — E875 Hyperkalemia: Secondary | ICD-10-CM

## 2018-05-26 DIAGNOSIS — K7200 Acute and subacute hepatic failure without coma: Principal | ICD-10-CM

## 2018-05-26 DIAGNOSIS — Z77098 Contact with and (suspected) exposure to other hazardous, chiefly nonmedicinal, chemicals: Secondary | ICD-10-CM

## 2018-05-26 DIAGNOSIS — Z7189 Other specified counseling: Secondary | ICD-10-CM

## 2018-05-26 DIAGNOSIS — N183 Chronic kidney disease, stage 3 (moderate): Secondary | ICD-10-CM

## 2018-05-26 MED ORDER — LORAZEPAM 2 MG/ML IJ SOLN: 1.0000 mg | Freq: Four times a day (QID) | INTRAMUSCULAR | Status: DC | PRN

## 2018-05-26 MED ORDER — HYDROMORPHONE HCL 1 MG/ML IJ SOLN: 0.5000 mg | INTRAMUSCULAR | Status: DC | PRN

## 2018-05-26 MED ORDER — SCOPOLAMINE 1 MG/3DAYS TD PT72
1.0000 | MEDICATED_PATCH | TRANSDERMAL | Status: DC
  Administered 2018-05-26: 1.5 mg via TRANSDERMAL
  Filled 2018-05-26: qty 1

## 2018-05-26 NOTE — ED Notes (Signed)
Logan Cole   [phone]

## 2018-05-26 NOTE — Consult Note (Signed)
Consultation Note Date: 05/26/2018   Patient Name: Logan Cole  DOB: 1946/04/26  MRN: [id number]  Age / Sex: 72 y.o., male  PCP: Borum, Vista Mink, MD Referring Physician: Earl Lagos, MD  Reason for Consultation: Establishing goals of care  HPI/Patient Profile: 72 y.o. male  admitted on 06/01/2018     Clinical Assessment and Goals of Care:  72 year old gentleman with a history of cirrhosis, chronic diastolic heart failure, stage III chronic kidney disease, he is a veteran, has a past history of exposure to agent orange, was established with the [facility] in [place], [place].   Patient has been admitted to the medicine teaching service with nausea vomiting progressive confusion and agitation, ongoing decline in functional and cognitive status. Patient's hemoglobin was 5.5 g/dL. Patient was admitted to the hospital with working diagnosis of GI bleed, worsening hepatic encephalopathy.  Initially, he was seen by pulmonary and critical care medicine. Goals of care discussions were held by critical care medicine as well as primary service. Patient's family elected for DO NOT RESUSCITATE/DO NOT INTUBATE. Patient's family elected for comfort measures. Hence, palliative consult has been requested. Patient has already been placed on low-dose Dilaudid infusion.  Patient is at present still in the emergency department. He is to be transported to [facility] at [facility] shortly. His son and daughter are present at the bedside. His sister is present at the bedside. His wife has gone home to take a shower and collect some personal belongings and will be back at the hospital. I introduced myself and palliative care as follows:  Palliative medicine is specialized medical care for people living with serious illness. It focuses on providing relief from the symptoms and stress of a serious illness. The goal  is to improve quality of life for both the patient and the family.  Patient's daughter is here from [place]. We discussed about the patient's acute serious illnesses as well as his underlying chronic conditions. We'll elaborated on comfort care pathway. Introduced hospice philosophy of care. Discussed extensively about symptom management at end-of-life. Judicious use of opioids and benzodiazepines for comfort measures. Discussed about secretions management at end-of-life. Prognosis and shortness few hours to as long as some very limited number of days discussed frankly and compassionate leave the family present at the bedside.  All of their questions answered to the best of my ability. Please note additional summary of recommendations as listed below. Thank you for the consult.  HCPOA  wife Son daughter Sister Lives in [place] He is a Cytogeneticist, has been going to the [facility] in [place], [place] for his care.    SUMMARY OF RECOMMENDATIONS    DNR DNI Comfort care Dilaudid drip rate adjusted, also added PRN IV Dilaudid Ativan IV PRN agitation, as well as Scopolamine patch for secretions.  Prognosis hours to days CSW consult to look into residential hospice, as per family preference, how ever, patient appears to be declining rapidly, probably anticipated hospital death.   Code Status/Advance Care Planning:  DNR  Symptom Management:    as above   Palliative Prophylaxis:   Delirium Protocol  Additional Recommendations (Limitations, Scope, Preferences):  Full Comfort Care  Psycho-social/Spiritual:   Desire for further Chaplaincy support:yes  Additional Recommendations: Education on Hospice  Prognosis:   Hours - Days  Discharge Planning: Hospice facility      Primary Diagnoses: Present on Admission: . Hepatic encephalopathy (HCC) . Liver cirrhosis (HCC)   I have reviewed the medical record, interviewed the patient and family, and examined the patient. The following  aspects are pertinent.  Past Medical History:  Diagnosis Date  . Aortic atherosclerosis (HCC) 09/30/2017  . CHF (congestive heart failure) (HCC)   . CKD (chronic kidney disease)   . Diabetes mellitus without complication (HCC)   . Liver dysfunction   . Morbid obesity with BMI of 50.0-59.9, adult (HCC) 09/29/2017  . Renal cyst, right   . Renal disorder   . Retinal vein occlusion of left eye   . Thrombocytopenia (HCC)    Social History   Socioeconomic History  . Marital status: Married    Spouse name: Not on file  . Number of children: Not on file  . Years of education: Not on file  . Highest education level: Not on file  Occupational History  . Not on file  Social Needs  . Financial resource strain: Not on file  . Food insecurity:    Worry: Not on file    Inability: Not on file  . Transportation needs:    Medical: Not on file    Non-medical: Not on file  Tobacco Use  . Smoking status: Never Smoker  . Smokeless tobacco: Never Used  Substance and Sexual Activity  . Alcohol use: No    Frequency: Never  . Drug use: No  . Sexual activity: Not on file  Lifestyle  . Physical activity:    Days per week: Not on file    Minutes per session: Not on file  . Stress: Not on file  Relationships  . Social connections:    Talks on phone: Not on file    Gets together: Not on file    Attends religious service: Not on file    Active member of club or organization: Not on file    Attends meetings of clubs or organizations: Not on file    Relationship status: Not on file  Other Topics Concern  . Not on file  Social History Narrative  . Not on file   Family History  Problem Relation Age of Onset  . Hypertension Mother   . Diabetes Father    Scheduled Meds: . sodium chloride   Intravenous Once  . scopolamine  1 patch Transdermal Q72H   Continuous Infusions: . HYDROmorphone 0.5 mg/hr (06/02/2018 1814)   PRN Meds:.glycopyrrolate, HYDROmorphone (DILAUDID) injection,  LORazepam Medications Prior to Admission:  Prior to Admission medications   Medication Sig Start Date End Date Taking? Authorizing Provider  acetaminophen (TYLENOL) 500 MG tablet Take 500 mg by mouth every 4 (four) hours as needed for mild pain.   Yes [provider]  ammonium lactate (LAC-HYDRIN) 12 % lotion Apply 1 application topically daily as needed for dry skin.   Yes [provider]  clopidogrel (PLAVIX) 75 MG tablet Take 75 mg by mouth daily.   Yes [provider]  clotrimazole (LOTRIMIN) 1 % cream Apply 1 application topically daily. To feet   Yes [provider]  ferrous sulfate 325 (65 FE) MG tablet Take 325 mg by  mouth See admin instructions. Take 1 tablet by mouth Monday, Wednesday and Friday for chronic anemia.   Yes [provider]  folic acid (FOLVITE) 1 MG tablet Take 1 mg by mouth daily.   Yes [provider]  furosemide (LASIX) 20 MG tablet Take 20 mg by mouth daily.   Yes [provider]  glucose 4 GM chewable tablet Chew 4 tablets by mouth See admin instructions. Chew 4 tablets by mouth every 15 minutes as needed (repeat every 15 minutes if blood sugar less than 70)   Yes [provider]  Insulin Glargine (LANTUS SOLOSTAR) 100 UNIT/ML Solostar Pen Inject 50 Units into the skin 2 (two) times daily.   Yes [provider]  ipratropium-albuterol (DUONEB) 0.5-2.5 (3) MG/3ML SOLN Take 3 mLs by nebulization every 6 (six) hours as needed (SOB).   Yes [provider]  lactulose (CHRONULAC) 10 GM/15ML solution Take 10 g by mouth 2 (two) times daily.   Yes [provider]  metoprolol tartrate (LOPRESSOR) 25 MG tablet Take 12.5 mg by mouth 2 (two) times daily.   Yes [provider]  NON FORMULARY Apply 1 application topically daily as needed ("Lanolin/Mineral Oil" unscented - as directed).   Yes [provider]  Oxycodone HCl 10 MG TABS Take 10 mg by mouth every 12 (twelve)  hours as needed (pain).   Yes [provider]  pantoprazole (PROTONIX) 40 MG tablet Take 40 mg by mouth daily.    Yes [provider]  QUEtiapine (SEROQUEL) 300 MG tablet Take 300 mg by mouth at bedtime.   Yes [provider]  rifaximin (XIFAXAN) 550 MG TABS tablet Take 550 mg by mouth 2 (two) times daily.    Yes [provider]  sertraline (ZOLOFT) 100 MG tablet Take 1 tablet (100 mg total) by mouth daily. 12/23/17  Yes Shirley, [place], DO  Skin Protectants, Misc. (EUCERIN) cream Apply 1 application topically daily. To arms and legs   Yes [provider]  spironolactone (ALDACTONE) 100 MG tablet Take 100 mg by mouth daily.   Yes [provider]  oxyCODONE (OXY IR/ROXICODONE) 5 MG immediate release tablet Take 1 tablet (5 mg total) by mouth every 4 (four) hours as needed for severe pain. Patient not taking: Reported on 05/18/2018 12/22/17   Shirley, [place], DO  polyethylene glycol [facility] / GLYCOLAX) packet Take 17 g by mouth daily as needed for mild constipation. Patient not taking: Reported on 05/14/2018 12/22/17   Shirley, [place], DO  zinc oxide 20 % ointment Apply 1 application topically 2 (two) times daily as needed for irritation.    [provider]   Allergies  Allergen Reactions  . Lescol [Fluvastatin Sodium] Other (See Comments)    unspecified  . Other Other (See Comments)    Non-steroidal anti-inflammatory  . Vardenafil Other (See Comments)    unspecified  . Morphine And Related Rash   Review of Systems Restless Non verbal  Physical Exam General: Asleep, not arousable to voice or light touch CVS: Regular rate and rhythm, normal heart sounds Lungs: Bibasilar crackles noted, coarse breath sounds.  Abdomen: Soft, nontender, distended, normoactive bowel sounds Extremities: 2+ bilateral lower extremity pitting edema noted  Vital Signs: BP 129/76   Pulse 98   Resp 10   SpO2 97%  Pain Scale: Faces   Pain Score:  Asleep   SpO2: SpO2: 97 % O2 Device:SpO2: 97 % O2 Flow Rate: .   IO: Intake/output summary: No intake or output data in  the 24 hours ending 05/26/18 1259  LBM:   Baseline Weight:   Most recent weight:       Palliative Assessment/Data:   PPS 10%  Time In:  12 Time Out:  13.10 Time Total:  70 min  Greater than 50%  of this time was spent counseling and coordinating care related to the above assessment and plan.  Signed by: Rosalin Hawking, MD  [phone]   Please contact Palliative Medicine Team phone at [phone] for questions and concerns.  For individual provider: See Loretha Stapler

## 2018-05-26 NOTE — Progress Notes (Signed)
   Subjective: Logan Cole appeared more comfortable this morning with some mild tachypnea. Wife and son were at the bedside and all questions and concerns were addressed.  Objective:  Vital signs in last 24 hours: Vitals:   05/26/18 0400 05/26/18 0430 05/26/18 0500 05/26/18 0530  BP: 131/74 (!) 115/54 (!) 129/52 129/63  Pulse: 94 93 90 92  Resp: 12 11 14 11   SpO2: 100% 99% 96% 98%   General- appeared more comfortable, not agitated,  Heart- RRR, no murmurs Lungs- crackles on lateral lung fields Abdomen- distended, bowel sounds present  Extremities- 2+ pitting edema   Assessment/Plan:  Principal Problem:   Hepatic encephalopathy (HCC) Active Problems:   Liver cirrhosis (HCC)  Logan Cole is a 72 year old male with past medical history significant for liver cirrhosis, agent orange exposure, chronic diastolic congestive heart failure, and CKD who presented with altered mental status.  Hepatic Encephalopathy  - pt is not agitated today, appeared more comfortable on exam with mild tachypnea - continue dilaudid infusion for comfort  - chaplain and palliative care consulted - c/w lactulose enema 200 gm - c/w glycopyrrolate 0.4mg   Anemia - Hgb 5.5 on admission - family does not want transfusion at this time due to patient's volume status - continue to monitor vitals  Hyperkalemia - K 5.3 on admission, no bmp repeated at this time due to plan to proceed with comfort measures  CHF -Last echo from May 2019 showed an LVEF of 60 to 65%.  No regional wall or valvular abnormalities. -Home medications include metoprolol 25 mg, spironolactone 100 mg, furosemide 20 mg - given one time dose of furosemide 20 mg IV   CKD - Cr on admission 2.55, unclear baseline, prior to 5/19 ~1.3, since 5/19 has ranged from 2-3   Dispo: Pending palliative care consult and hospice bed placement.   Jaci Standard, DO 05/26/2018, 6:55 AM Pager: [phone]

## 2018-05-26 NOTE — ED Notes (Signed)
Report attempted 

## 2018-05-26 NOTE — Progress Notes (Signed)
   05/26/18 1700  Clinical Encounter Type  Visited With Patient and family together  Visit Type Initial;Spiritual support  Referral From Palliative care team  Consult/Referral To Chaplain  Spiritual Encounters  Spiritual Needs Prayer;Grief support  Chaplain responded to [facility] team request for spiritual care. The chaplain was welcomed into the Pt. room by Pt. wife and daughter.  The pastoral conversation with the family reflected on 36 years of marriage and memories of a father's love for his children and his country. The Pt. wife shared the Pt. has spent a life time protecting people.  The family shared [facility] faith community connections in [place].  The chaplain noticed the Pt. wife continued to smile during the conversation and the daughter's eyes were often filled with tears.  The family welcomed the opportunity to pray together. The chaplain encouraged the family to reach out to Spiritual Care office/chaplain as needed.

## 2018-05-26 NOTE — Progress Notes (Signed)
  Date: 05/26/2018  Patient name: Logan Cole  Medical record number: [id number]  Date of birth: 1946-06-29   I have seen and evaluated Gwendlyn Deutscher and discussed their care with the Residency Team.  In brief, patient is a 72 year old male with a past medical history of cirrhosis, chronic diastolic heart failure and CKD stage III who presented to the ED with altered mental status over the last 2 days.  Patient unable to provide history.  History obtained from chart as well as family at bedside.  Patient at baseline has been progressively deteriorating and his functional status has been worsening slowly.  2 days ago patient developed multiple episodes of nausea and vomiting.  He then developed progressive confusion and agitation and was brought to the ED for further evaluation.  Family also reported that he had dark stools for the past few days but no blood was noted in his vomitus.  Patient also has stopped taking his lactulose over the last month.  At baseline, patient is able to walk to the bathroom and back but otherwise sits in his recliner.  He was noted to have a hemoglobin of 5.5 and was suspected to have a GI bleed as well as worsening hepatic encephalopathy secondary to his bleed as well as his medication noncompliance with lactulose.  PCCM was initially consulted for possible intubation and medical management but family opted to make patient DNR/DNI and put the patient on comfort care.  Today patient is mildly tachypneic and not arousable.  Plan discussed with wife and son at bedside.  They would like the patient to be placed in inpatient hospice.   PMHx, Fam Hx, and/or Soc Hx : As per resident admit note  Vitals:   05/26/18 0930 05/26/18 0945  BP: 136/71   Pulse: 93 93  Resp: 11 11  SpO2: 100% 100%   General: Asleep, not arousable to voice or light touch CVS: Regular rate and rhythm, normal heart sounds Lungs: Bibasilar crackles noted Abdomen: Soft, nontender, distended,  normoactive bowel sounds Extremities: 2+ bilateral lower extremity pitting edema noted  Assessment and Plan: I have seen and evaluated the patient as outlined above. I agree with the formulated Assessment and Plan as detailed in the residents' note, with the following changes:   1.  Acute hepatic encephalopathy: -Patient was admitted with altered mental status in setting of GI bleed and noncompliance with lactulose at home likely secondary to acute hepatic encephalopathy. -Family is opted to make the patient comfort care and is requesting inpatient hospice placement -We will continue with Dilaudid infusion for comfort given tachypnea as well as agitation on admission -Palliative care consulted.  We will follow-up recommendations -Would DC lactulose enema given family wishes for comfort care -Continue with glycopyrrolate 0.4 mg as needed for increased secretions -Patient with acute blood loss anemia with a hemoglobin of 5.5 likely secondary to a GI bleed -We will not transfuse at this time secondary to family wishes for patient to be on comfort care.  It is also possible that transfusing him may need to further pulmonary congestion which would lead to increased respiratory discomfort. -No further work-up at this time -Case discussed with wife and son at bedside were in agreement with plan  Earl Lagos, MD 10/19/201910:52 AM

## 2018-05-26 NOTE — ED Notes (Signed)
Dr. Karilyn Cota returned paged.  Lactulose will be d/c.

## 2018-05-26 NOTE — ED Notes (Signed)
Admitting MDs at bedside.

## 2018-05-27 NOTE — Progress Notes (Signed)
   Subjective: Evaluated patient this morning. No family at bedside.  Patient was having secretions suctioned by nursing staff. He appeared comfortable.  Objective:  Vital signs in last 24 hours: Vitals:   05/26/18 1030 05/26/18 1100 05/26/18 1200 05/26/18 1230  BP: 120/68 (!) 140/46 135/69 129/76  Pulse: 95 95 95 98  Resp: 11 11 12 10   SpO2: 98% 97% 98% 97%   Physical Exam  Constitutional: No distress.  Pulmonary/Chest: No respiratory distress.  Yellow secretions noted from suction  Skin: Skin is warm and dry.  2+ pitting edema bilaterally     Assessment/Plan:  Principal Problem:   Hepatic encephalopathy (HCC) Active Problems:   Liver cirrhosis (HCC)   Altered mental status   Palliative care by specialist   Goals of care, counseling/discussion  Acute hepatic encephalopathy Palliative care saw patient yesterday and plan is for residential hospice.  Patient appeared comfortable on exam however clinical course is tenuous and patient may pass in the hospital. Will continue with current comfort care measures. -dilaudid drip -ativan IV Q6 PRN for agitation  -scopolamine patch and robinul   Dispo: Anticipated discharge to residential hospice.   Geralyn Corwin [place], DO 05/27/2018, 9:09 AM Pager: [phone]

## 2018-05-27 NOTE — Progress Notes (Signed)
   05/27/18 1000  Clinical Encounter Type  Visited With Family  Visit Type Initial;Patient actively dying  Referral From Palliative care team  Spiritual Encounters  Spiritual Needs Emotional;Grief support  Stress Factors  Family Stress Factors Major life changes   Responded to spiritual care consult for end of life.  Met w/ pt's sister and wife outside of room.  Supportive listening; family reported that their pastor was here overnight, 10pm-3:30am.  Reported they didn't need chaplain support at this time.  Chaplain remains available for further support if desired.   M  Chaplain resident, [phone] 

## 2018-05-27 NOTE — Progress Notes (Signed)
Wasted 8.42ml of remaining dilaudid when changing out bags with Logan Cole as witness.

## 2018-06-08 NOTE — Progress Notes (Signed)
Wasted 45 mL of remaining Dilaudid after pt. expired with Darlina Sicilian, RN as witness.

## 2018-06-08 NOTE — Progress Notes (Signed)
Wasted 45 ML of remaining Dilaudid with Colin Mulders, RN

## 2018-06-08 NOTE — Death Summary Note (Signed)
  Name: Logan Cole MRN: [id number] DOB: 1946/01/20 72 y.o.  Date of Admission: 05/16/2018  3:04 PM Date of Discharge: 06-10-2018 Attending Physician: Dr. Heide Spark, [place]  Discharge Diagnosis: Principal Problem:   Hepatic encephalopathy (HCC) Active Problems:   Liver cirrhosis (HCC)   Altered mental status   Palliative care by specialist   Goals of care, counseling/discussion   Cause of death: Hepatic Encephalopathy  Time of death: ~330 am  Disposition and follow-up:   Logan Cole was discharged from [facility] in expired condition.    Hospital Course:  Patient presented via EMS to the ED with altered mental status. History was taken from wife, sister and son. Family reported the night before he started vomiting and this progressed through the night. He was last seen alert and oriented the night he started vomiting. Wife thought he was just sick from the vomiting but now presumes he may have been somewhat altered that night. This morning he was much more altered so they called EMS. He was very combative and agitated when the EMS arrived. Family also reported he had dark stools for the past few days but no blood in his vomit. He stopped taking his lactulose ~1 month ago because he felt sick on it and felt "what's the point." Family reported he was taking his other medications.   In the ED, he was found to be tachypneic, tachycardic, hypertensive. CBC showed a hemoglobin of 5.5. Potassium 5.3.  BUN 40 creatinine 2.55.  Ammonia 124.  Chest x-ray showed stable cardiomegaly with mild bilateral edema/infiltrates and congestion.  Became agitated in the ED and was given 1 dose of Ativan 1 mg and DuoNeb nebulization. Transfusion was ordered but could not be delivered due to patient's agitation. PCCM was consulted due to concerns for patient not being to protect airway. Family was informed that recovering from intubation/ventilator would be difficult for the patient and  possibly make him weaker. Family did not want patient to be intubated and requested comfort measures. They agreed to trying lasix and a lactulose enema. They did not want to transfuse blood at this time due to his volume status. They understand patient's clinical picture was tenuous and may deteriorate quickly. Family initially requested inpatient hospice services. After speaking with Palliative care plans were to continue comfort care and find residential hospice when bed available.  Comfort measures with dilaudid drip, ativan prn for agitation, scopolamine patch and robinul were continued. Patient passed away overnight on 06-10-2018.   Signed: Jaci Standard, DO 2018-06-10, 6:48 AM

## 2018-06-08 DEATH — deceased

## 2019-04-17 IMAGING — CT CT HEAD W/O CM
4 series · 15 of 47 positions shown, 17 images · non-contrast
Comparison: 11/14/2016

CLINICAL DATA: Minor head trauma, legs giving way, weaker than
normal for 3 weeks, history CHF, chronic kidney disease, diabetes
mellitus, cirrhosis

EXAM:
CT HEAD WITHOUT CONTRAST
TECHNIQUE: Contiguous axial images were obtained from the base of the skull
through the vertex without intravenous contrast. Sagittal and
coronal MPR images reconstructed from axial data set.

[Series 3: head without · axial · non-contrast · 0.47mm/px · z∈[+1255,+1375]mm · 7 of 33 slices shown, 9 images]
[im 5/33  brain]
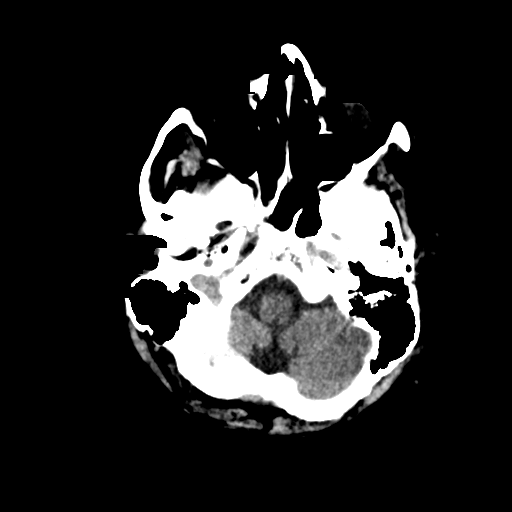
[im 5/33  bone]
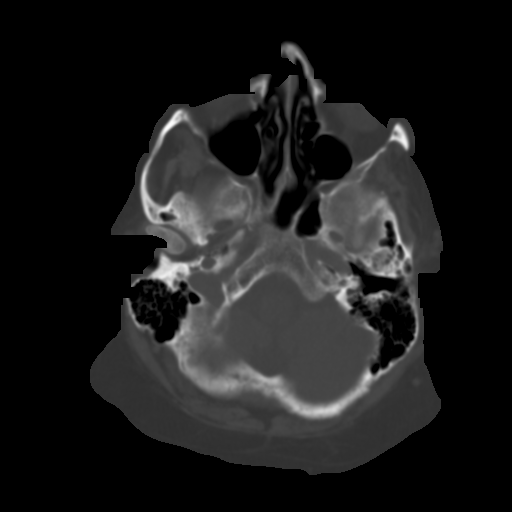
[im 9/33  brain]
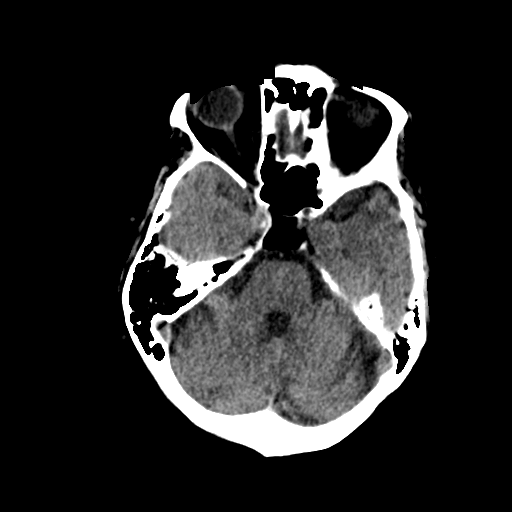
[im 13/33  brain]
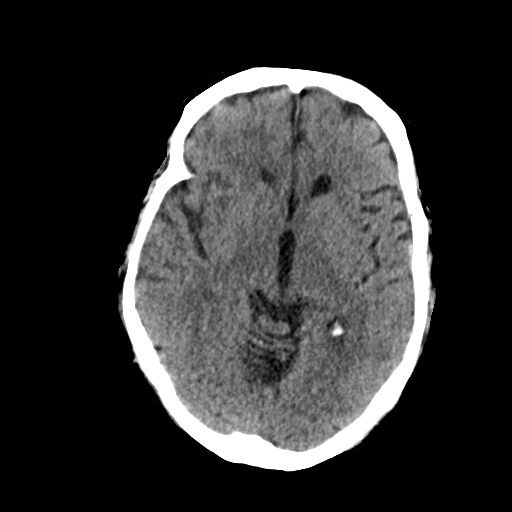
[im 17/33  brain]
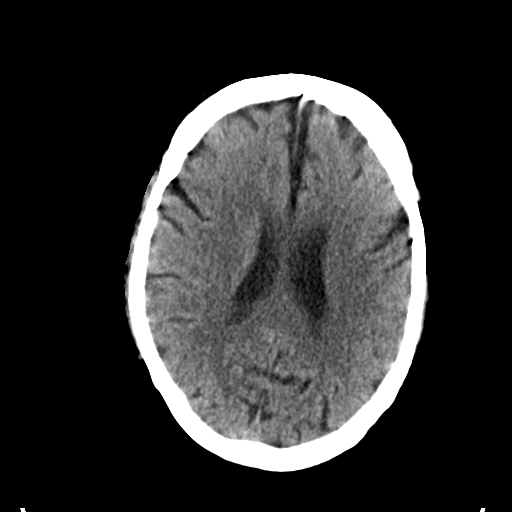
[im 21/33  brain]
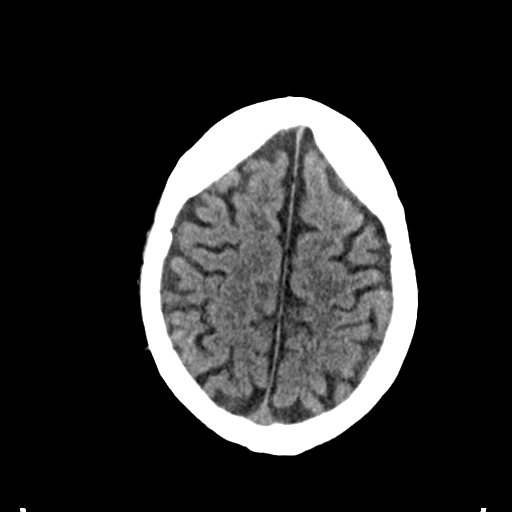
[im 21/33  bone]
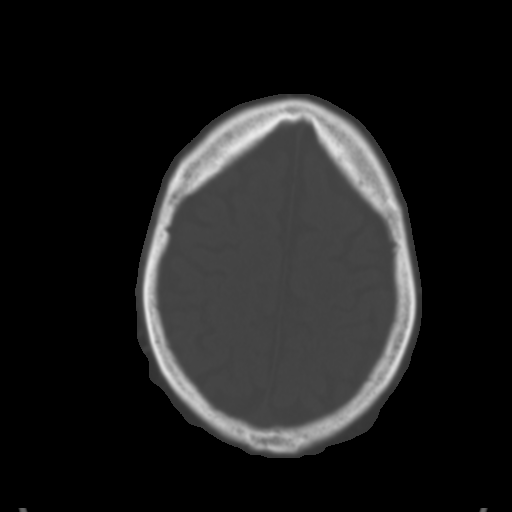
[im 25/33  brain]
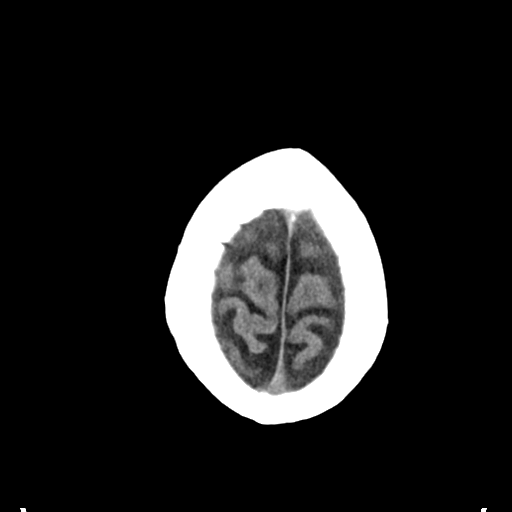
[im 29/33  brain]
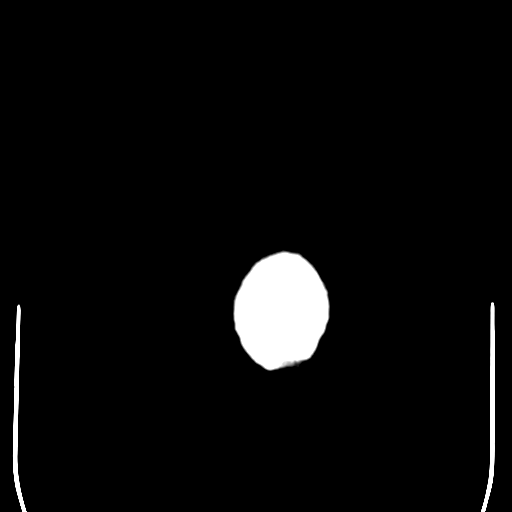

[Series 4: head bone · axial · 0.47mm/px · z∈[+1251,+1267]mm · 2 of 82 slices shown]
[im 9/82  bone]
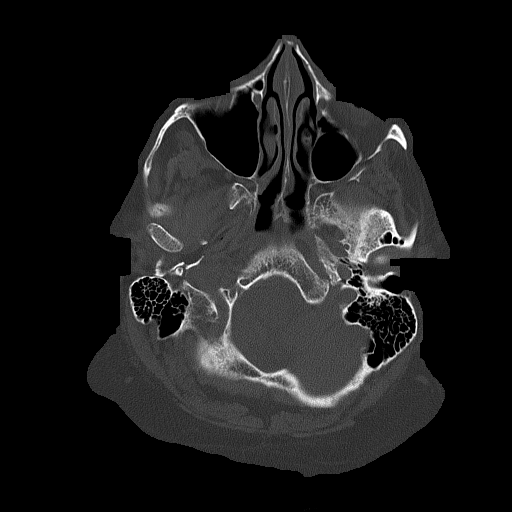
[im 17/82  bone]
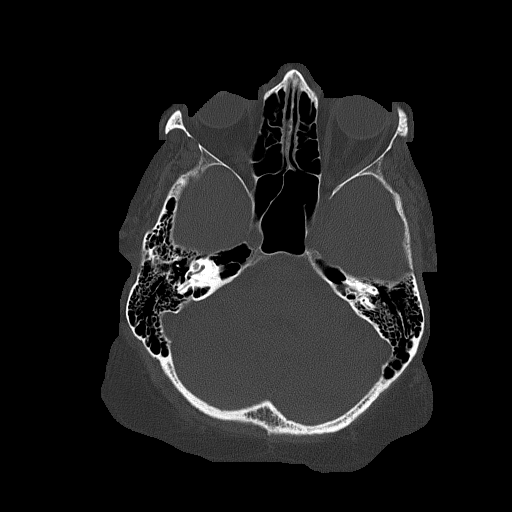

[Series 5: head without cor · coronal · non-contrast · 0.32mm/px · 3 of 67 slices shown]
[im 23/67  brain]
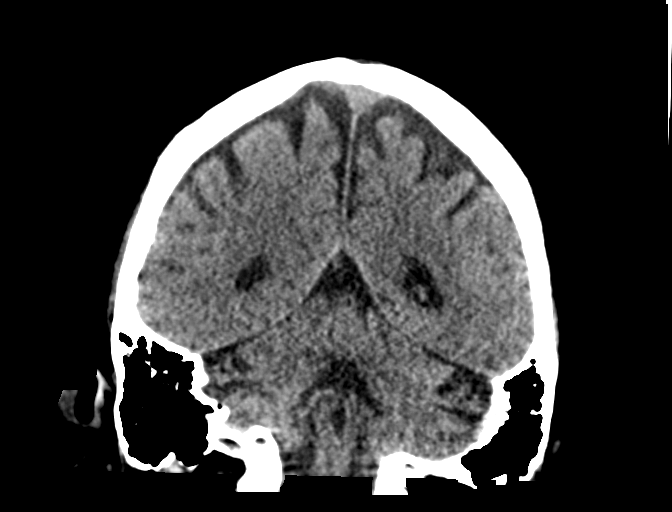
[im 30/67  brain]
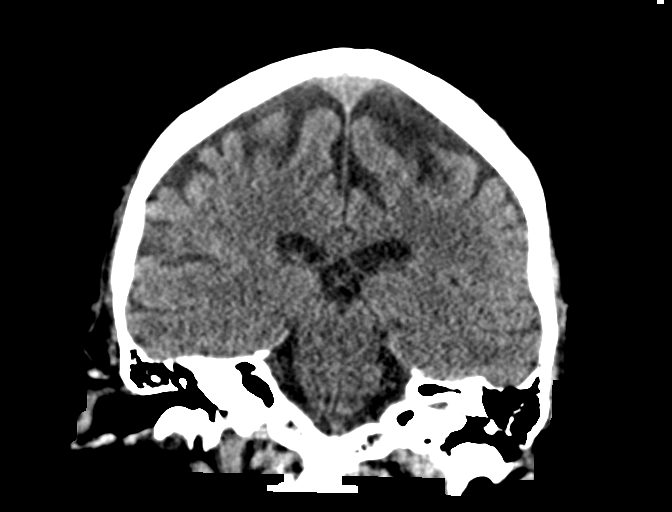
[im 37/67  brain]
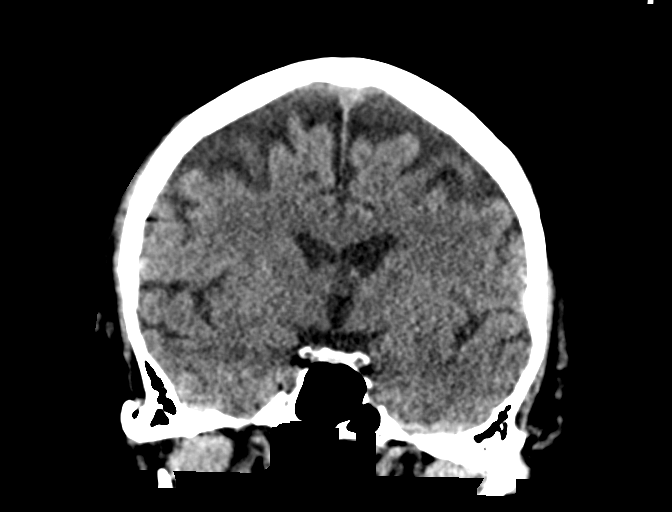

[Series 6: head without sag · sagittal · non-contrast · 0.32mm/px · 3 of 67 slices shown]
[im 23/67  brain]
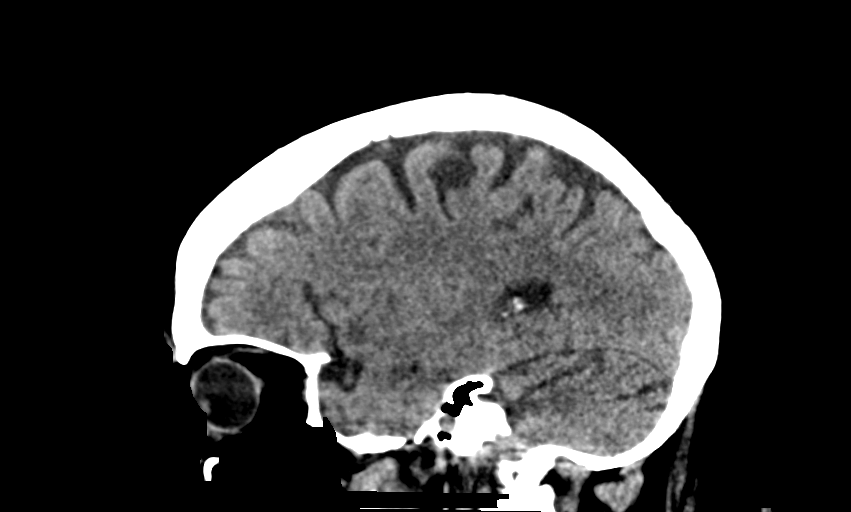
[im 34/67  brain]
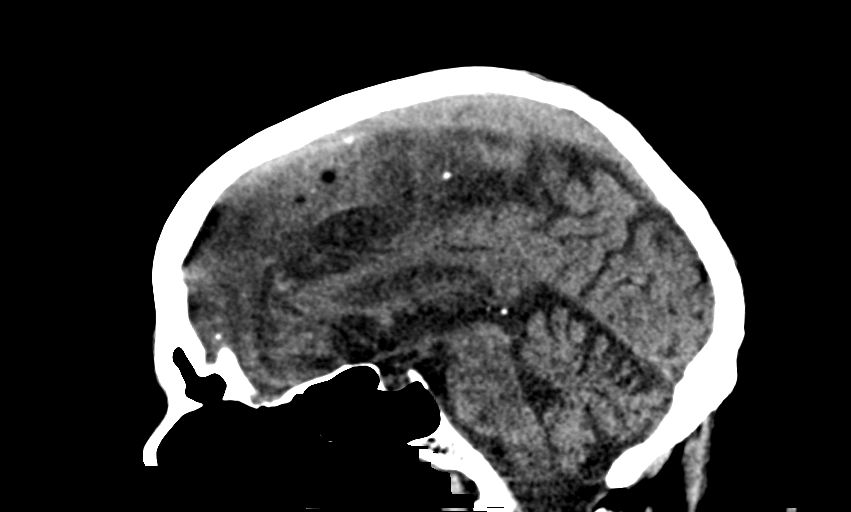
[im 45/67  brain]
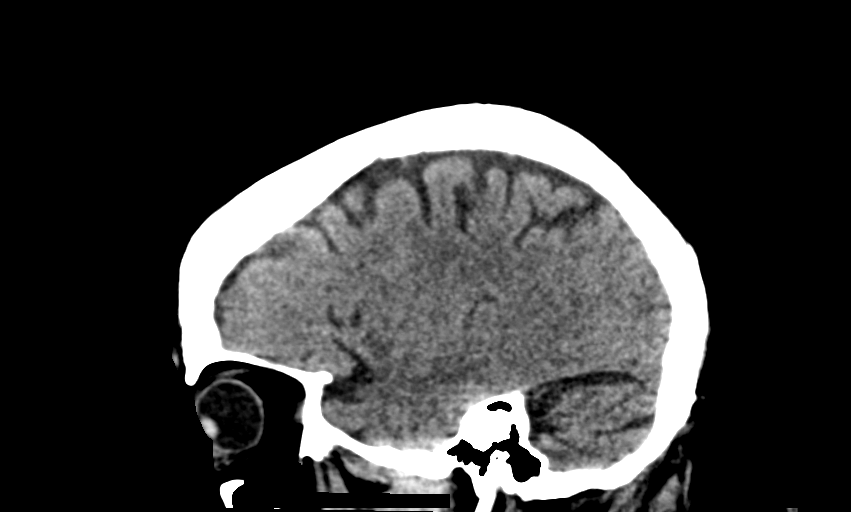

[15 of 47 positions shown; findings below may reference images not displayed]

FINDINGS: Brain: Generalized atrophy. Normal ventricular morphology. No
midline shift or mass effect. Minimal small vessel chronic ischemic
changes of deep cerebral white matter. No intracranial hemorrhage,
mass lesion, or evidence of acute infarction. No extra-axial fluid
collections.

Vascular: No hyperdense vessels.

Skull: Demineralized but intact

Sinuses/Orbits: Paranasal sinuses and mastoid air cells clear

Other: N/A
IMPRESSION: Atrophy with minimal small vessel chronic ischemic changes of deep
cerebral white matter.

No acute intracranial abnormalities.

## 2019-04-17 IMAGING — CT CT ABD-PELV W/O CM
2 of 4 series · 16 of 46 positions shown, 18 images · non-contrast
Comparison: CT scan of September 27, 2017.

CLINICAL DATA: Weakness.

EXAM:
CT ABDOMEN AND PELVIS WITHOUT CONTRAST
TECHNIQUE: Multidetector CT imaging of the abdomen and pelvis was performed
following the standard protocol without IV contrast.

[Series 3: a/p w/o 5mm · axial · non-contrast · 0.98mm/px · z∈[+461,+981]mm · 13 of 114 slices shown, 15 images]
[im 5/114  soft-tissue]
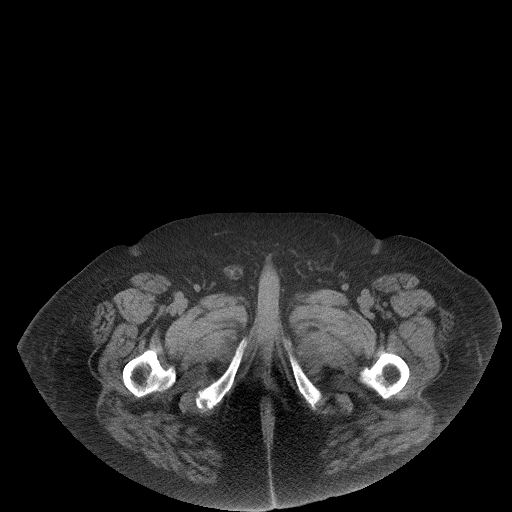
[im 5/114  bone]
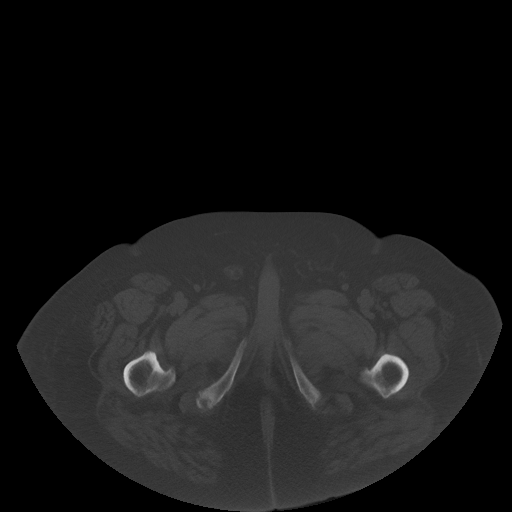
[im 15/114  soft-tissue]
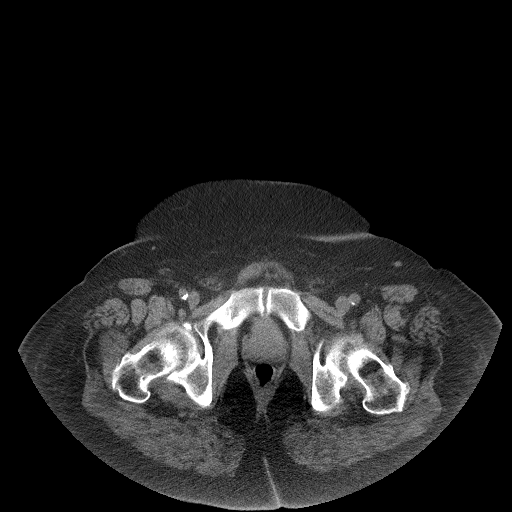
[im 24/114  soft-tissue]
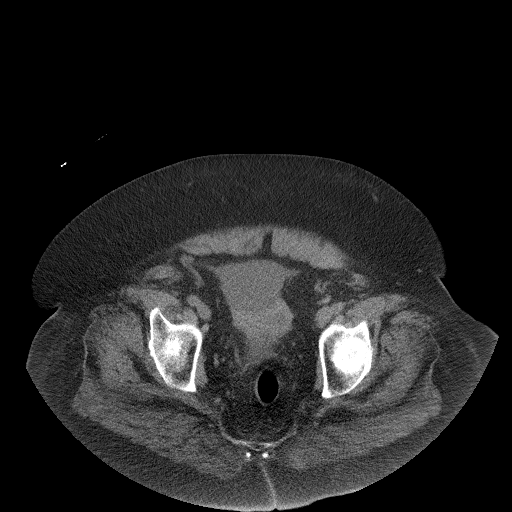
[im 33/114  soft-tissue]
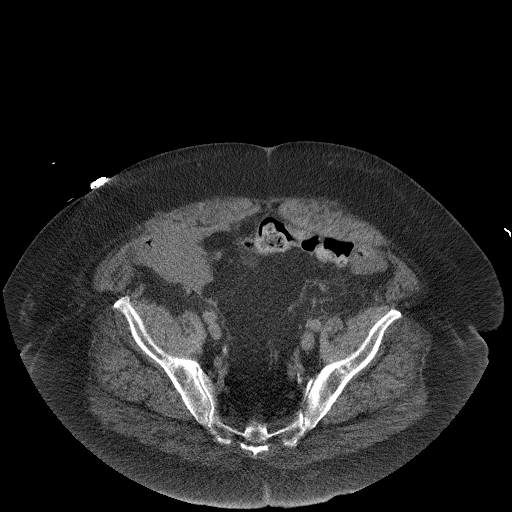
[im 38/114  soft-tissue]
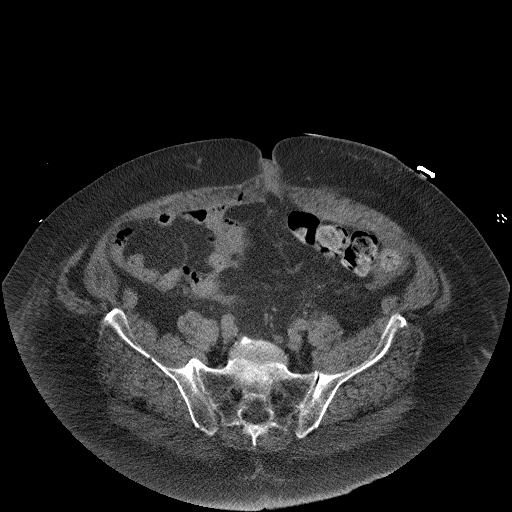
[im 48/114  soft-tissue]
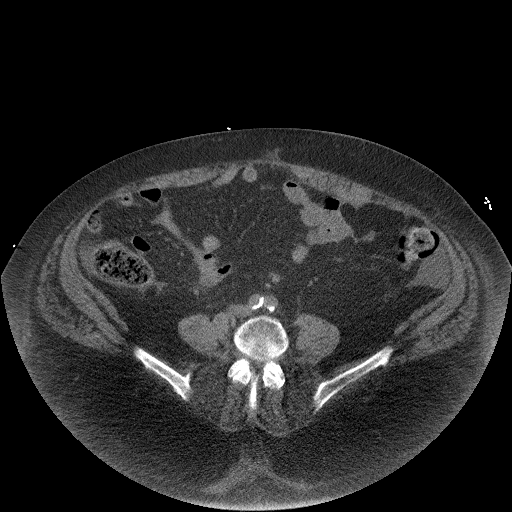
[im 57/114  soft-tissue]
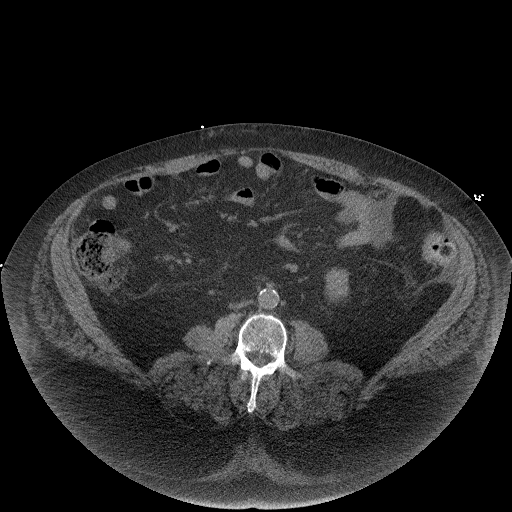
[im 66/114  soft-tissue]
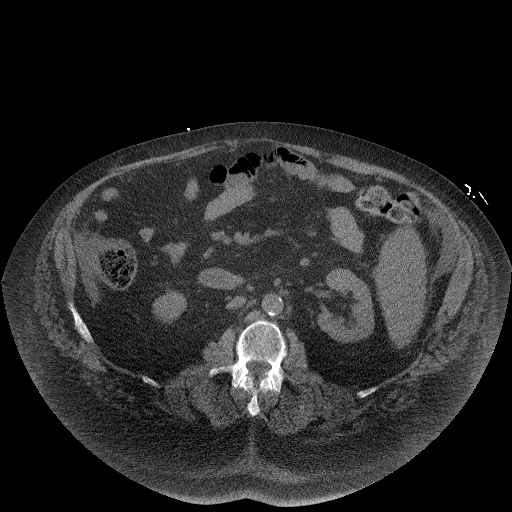
[im 76/114  soft-tissue]
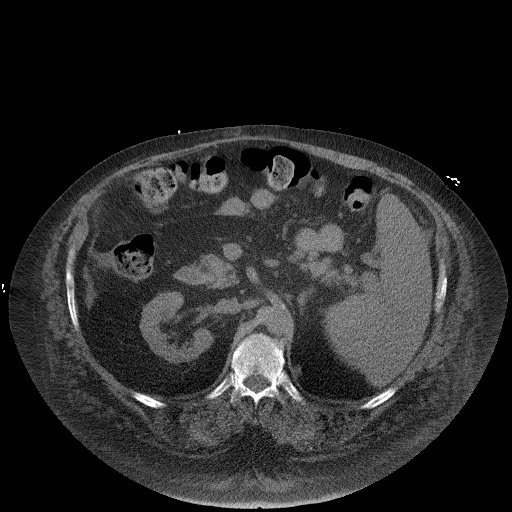
[im 76/114  bone]
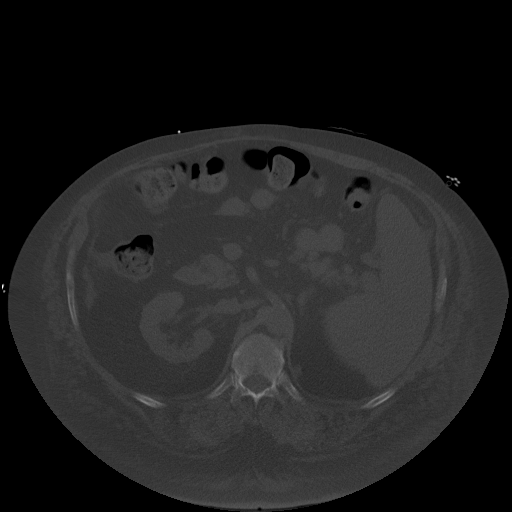
[im 81/114  soft-tissue]
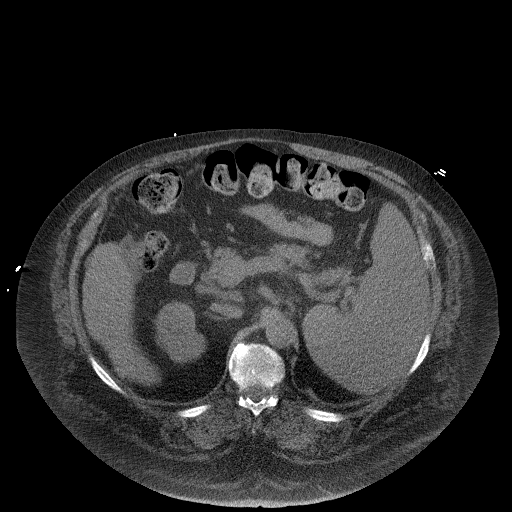
[im 90/114  soft-tissue]
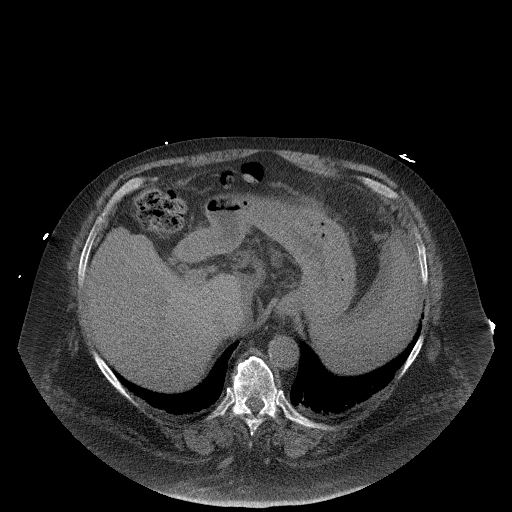
[im 99/114  soft-tissue]
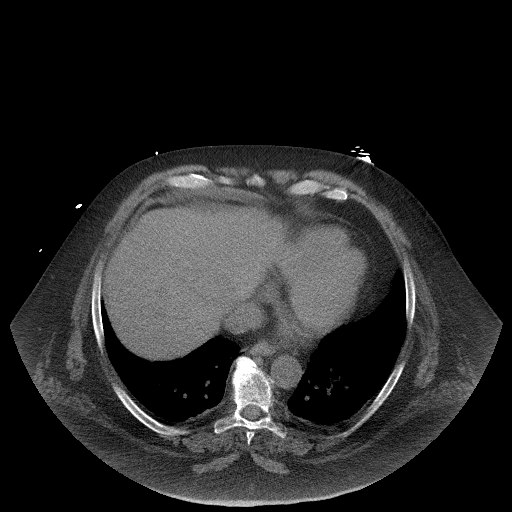
[im 109/114  soft-tissue]
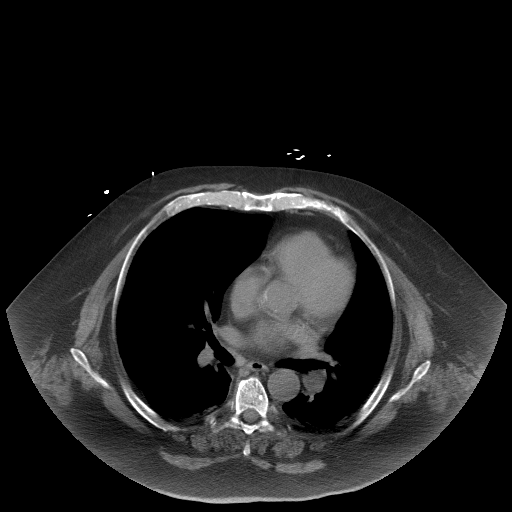

[Series 6: a/p w/o cor · coronal · non-contrast · 0.92mm/px · 3 of 192 slices shown]
[im 64/192  soft-tissue]
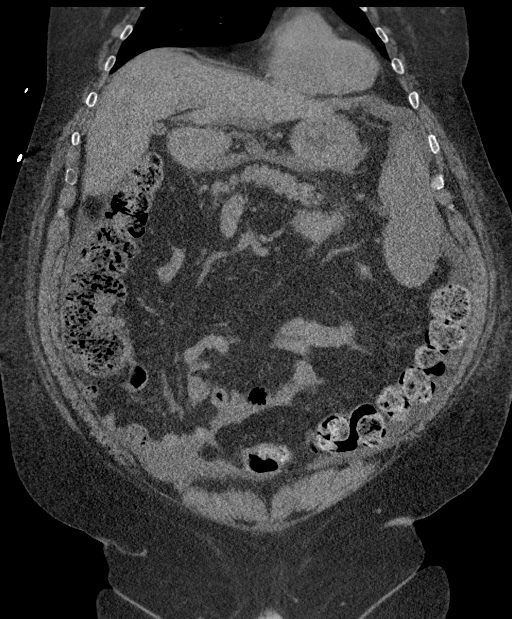
[im 85/192  soft-tissue]
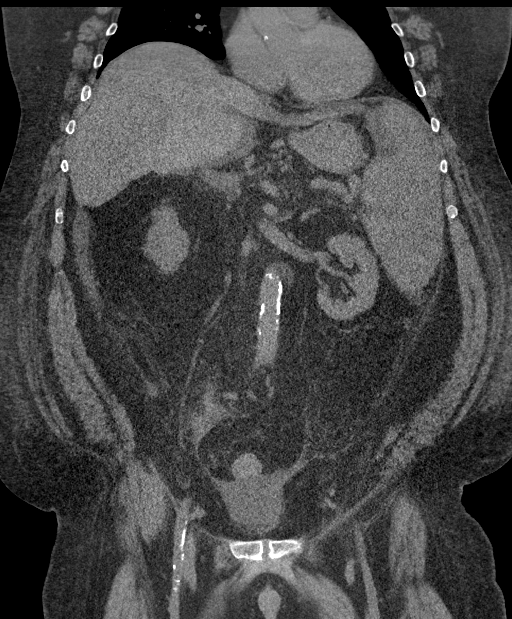
[im 107/192  soft-tissue]
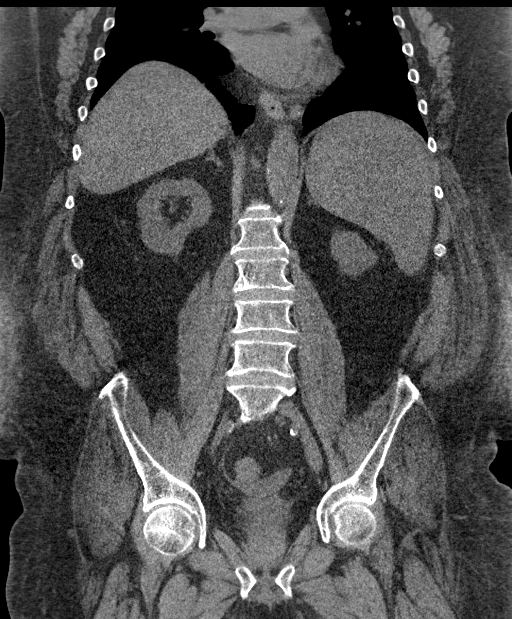

[16 of 46 positions shown; findings below may reference images not displayed]

FINDINGS: Lower chest: No acute abnormality.

Hepatobiliary: Status post colectomy. Nodular hepatic margins are
noted concerning for hepatic cirrhosis. No significant biliary
dilatation is noted.

Pancreas: Unremarkable. No pancreatic ductal dilatation or
surrounding inflammatory changes.

Spleen: Severe splenomegaly is noted.

Adrenals/Urinary Tract: Adrenal glands appear normal. 4 cm rounded
low density is seen arising from upper pole of right kidney with
Hounsfield measurement of 11, most consistent with cyst. Stable
cm partially exophytic cyst is seen arising posteriorly from lower
pole of right kidney. No hydronephrosis or renal obstruction is
noted. No renal or ureteral calculi are noted. Urinary bladder is
decompressed.

Stomach/Bowel: The stomach appears normal. There is no evidence of
bowel obstruction or inflammation. Status post appendectomy.

Vascular/Lymphatic: Atherosclerosis of abdominal aorta is noted
without aneurysm formation. Stable adenopathy is noted in porta
hepatis region which most likely is due to hepatic cirrhosis.

Reproductive: Prostate is unremarkable.

Other: Minimal to mild ascites is noted. This is decreased compared
to prior exam.

Musculoskeletal: No acute or significant osseous findings.
IMPRESSION: Hepatic cirrhosis with severe splenomegaly. Minimal to mild ascites
is noted which is significantly improved compared to prior exam.

Stable right renal cysts are noted.

Aortic Atherosclerosis ([id number]).
